# Patient Record
Sex: Male | Born: 1990
Health system: Southern US, Community
[De-identification: ages and names within clinical notes are randomized; demographics above are authoritative.]

## PROBLEM LIST (undated history)

## (undated) DIAGNOSIS — R4689 Other symptoms and signs involving appearance and behavior: Secondary | ICD-10-CM

## (undated) HISTORY — PX: TONSILLECTOMY: SUR1361

## (undated) HISTORY — DX: Other symptoms and signs involving appearance and behavior: R46.89

---
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
## 2012-03-04 IMAGING — CR DG TIBIA/FIBULA 2V*R*
2 series · 2 of 2 positions shown · non-contrast
Comparison: None.

CLINICAL DATA: Lower leg pain.

RIGHT TIBIA AND FIBULA - 2 VIEW

[view not recorded (1 of 2)]
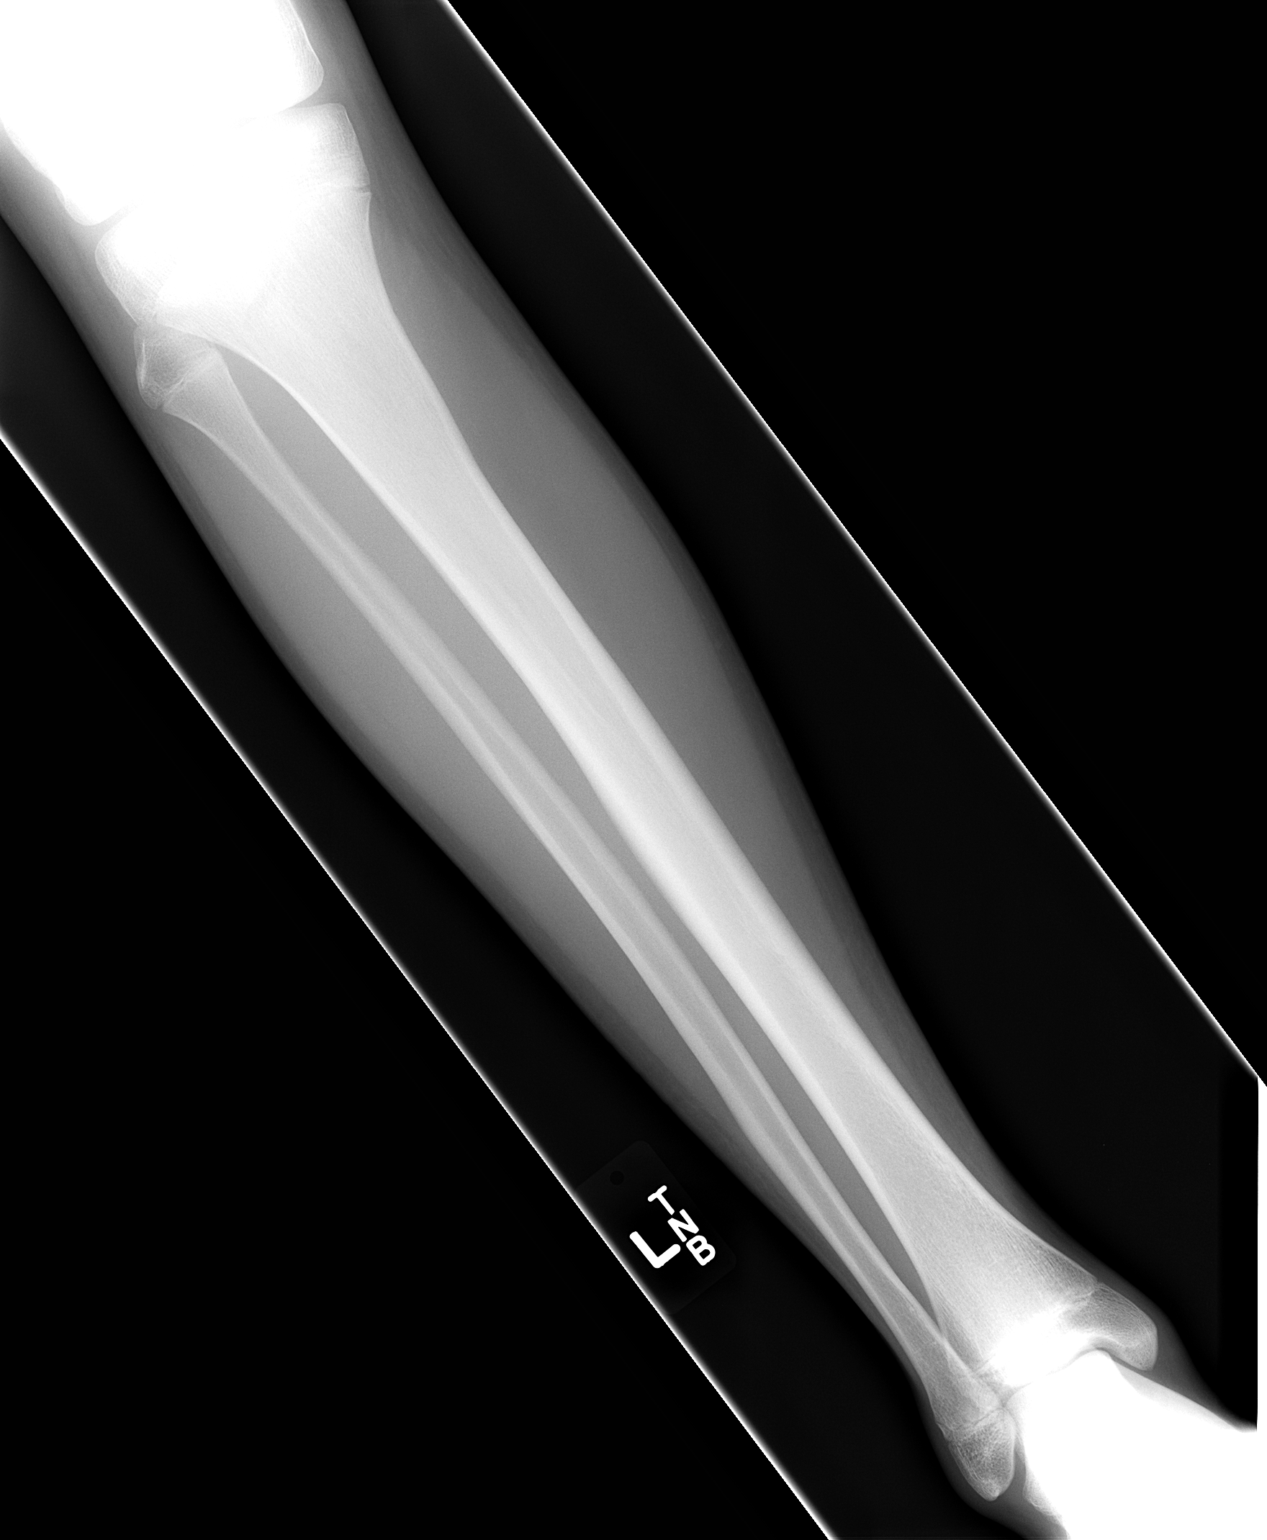

[view not recorded (2 of 2)]
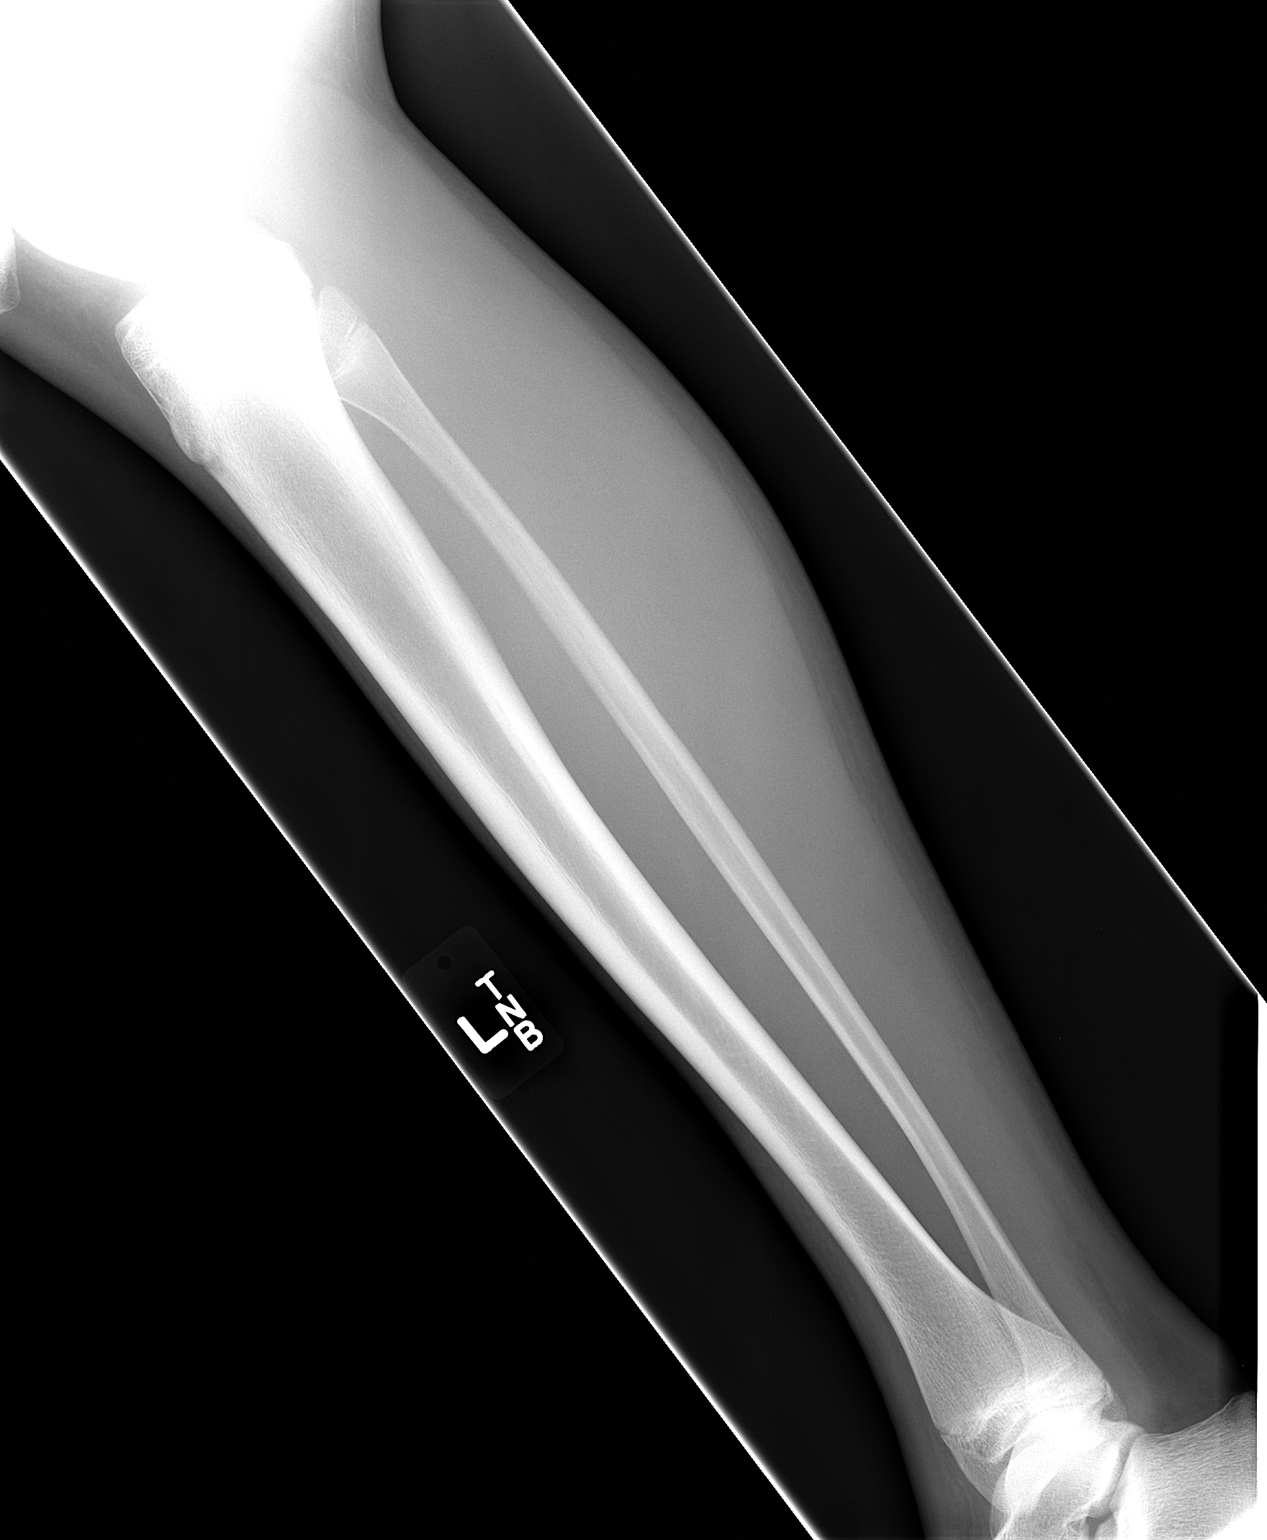

[2 of 2 positions shown; findings below may reference images not displayed]

FINDINGS: No acute osseous abnormality.
IMPRESSION: No acute osseous abnormality.

## 2012-07-30 ENCOUNTER — Ambulatory Visit: Payer: Self-pay | Admitting: Pediatrics

## 2012-08-01 ENCOUNTER — Encounter: Payer: Self-pay | Admitting: Pediatrics

## 2012-08-01 ENCOUNTER — Ambulatory Visit (INDEPENDENT_AMBULATORY_CARE_PROVIDER_SITE_OTHER): Payer: 59 | Admitting: Pediatrics

## 2012-08-01 VITALS — Temp 98.4°F | Wt 122.4 lb

## 2012-08-01 DIAGNOSIS — Z008 Encounter for other general examination: Secondary | ICD-10-CM

## 2012-08-01 DIAGNOSIS — IMO0001 Reserved for inherently not codable concepts without codable children: Secondary | ICD-10-CM

## 2012-08-01 DIAGNOSIS — Z0189 Encounter for other specified special examinations: Secondary | ICD-10-CM

## 2012-08-01 DIAGNOSIS — R4689 Other symptoms and signs involving appearance and behavior: Secondary | ICD-10-CM

## 2012-08-01 HISTORY — DX: Other symptoms and signs involving appearance and behavior: R46.89

## 2012-08-01 LAB — PULMONARY FUNCTION TEST

## 2012-08-01 MED ORDER — ALBUTEROL SULFATE (2.5 MG/3ML) 0.083% IN NEBU
2.5000 mg | INHALATION_SOLUTION | Freq: Once | RESPIRATORY_TRACT | Status: AC
  Administered 2012-08-01: 2.5 mg via RESPIRATORY_TRACT

## 2012-08-01 NOTE — Addendum Note (Signed)
Addended by: Rolena Infante on: 08/01/2012 11:44 AM   Modules accepted: Orders

## 2012-08-01 NOTE — Patient Instructions (Signed)
Nicotine Addiction Nicotine can act as both a stimulant (excites/activates) and a sedative (calms/quiets). Immediately after exposure to nicotine, there is a "kick" caused in part by the drug's stimulation of the adrenal glands and resulting discharge of adrenaline (epinephrine). The rush of adrenaline stimulates the body and causes a sudden release of sugar. This means that smokers are always slightly hyperglycemic. Hyperglycemic means that the blood sugar is high, just like in diabetics. Nicotine also decreases the amount of insulin which helps control sugar levels in the body. There is an increase in blood pressure, breathing, and the rate of heart beats.  In addition, nicotine indirectly causes a release of dopamine in the brain that controls pleasure and motivation. A similar reaction is seen with other drugs of abuse, such as cocaine and heroin. This dopamine release is thought to cause the pleasurable sensations when smoking. In some different cases, nicotine can also create a calming effect, depending on sensitivity of the smoker's nervous system and the dose of nicotine taken. WHAT HAPPENS WHEN NICOTINE IS TAKEN FOR LONG PERIODS OF TIME?  Long-term use of nicotine results in addiction. It is difficult to stop.  Repeated use of nicotine creates tolerance. Higher doses of nicotine are needed to get the "kick." When nicotine use is stopped, withdrawal may last a month or more. Withdrawal may begin within a few hours after the last cigarette. Symptoms peak within the first few days and may lessen within a few weeks. For some people, however, symptoms may last for months or longer. Withdrawal symptoms include:   Irritability.  Craving.  Learning and attention deficits.  Sleep disturbances.  Increased appetite. Craving for tobacco may last for 6 months or longer. Many behaviors done while using nicotine can also play a part in the severity of withdrawal symptoms. For some people, the feel,  smell, and sight of a cigarette and the ritual of obtaining, handling, lighting, and smoking the cigarette are closely linked with the pleasure of smoking. When stopped, they also miss the related behaviors which make the withdrawal or craving worse. While nicotine gum and patches may lessen the drug aspects of withdrawal, cravings often persist. WHAT ARE THE MEDICAL CONSEQUENCES OF NICOTINE USE?  Nicotine addiction accounts for one-third of all cancers. The top cancer caused by tobacco is lung cancer. Lung cancer is the number one cancer killer of both men and women.  Smoking is also associated with cancers of the:  Mouth.  Pharynx.  Larynx.  Esophagus.  Stomach.  Pancreas.  Cervix.  Kidney.  Ureter.  Bladder.  Smoking also causes lung diseases such as lasting (chronic) bronchitis and emphysema.  It worsens asthma in adults and children.  Smoking increases the risk of heart disease, including:  Stroke.  Heart attack.  Vascular disease.  Aneurysm.  Passive or secondary smoke can also increase medical risks including:  Asthma in children.  Sudden Infant Death Syndrome (SIDS).  Additionally, dropped cigarettes are the leading cause of residential fire fatalities.  Nicotine poisoning has been reported from accidental ingestion of tobacco products by children and pets. Death usually results in a few minutes from respiratory failure (when a person stops breathing) caused by paralysis. TREATMENT   Medication. Nicotine replacement medicines such as nicotine gum and the patch are used to stop smoking. These medicines gradually lower the dosage of nicotine in the body. These medicines do not contain the carbon monoxide and other toxins found in tobacco smoke.  Hypnotherapy.  Relaxation therapy.  Nicotine Anonymous (a 12-step support   program). Find times and locations in your local yellow pages. Document Released: 09/07/2003 Document Revised: 03/26/2011 Document  Reviewed: 01/29/2007 [employer] Patient Information 2014 [employer].  

## 2012-08-01 NOTE — Progress Notes (Signed)
Patient ID: Christian Miranda, male   DOB: 09/04/1990, 22 y.o.   MRN: [id number]   Subjective:     Patient ID: Christian Miranda, male   DOB: August 19, 1990, 22 y.o.   MRN: [id number]  HPI: Pt here alone for PFTs required for joining the [employer]. The pt has a h/o mild asthma. He has not used and inhaler since elementary school. The pt is a smoker, but has cut down drastically to 1 cigarette a day now. He has been chewing tobacco to help with this.   ROS:  Apart from the symptoms reviewed above, there are no other symptoms referable to all systems reviewed. There was an issue with depression/ anxiety in the past. The pt had refused medications and therapy. He now reports no symptoms of depression.   Physical Examination  Temperature 98.4 F (36.9 C), temperature source Temporal, weight 122 lb 6 oz (55.509 kg). General: Alert, NAD HEENT: TM's - clear, Throat - clear, Neck - FROM, no meningismus, Sclera - clear LYMPH NODES: No LN noted LUNGS: CTA B CV: RRR without Murmurs  No results found. No results found for this or any previous visit (from the past 240 hour(s)). No results found for this or any previous visit (from the past 48 hour(s)).  Assessment:   PFTs done before and after Albuterol nebulizer treatment: no significant difference. Both wnl.  Plan:   Form with results given. Discussed careful use of chewing tobacco. RTC PRN

## 2013-01-27 ENCOUNTER — Ambulatory Visit (INDEPENDENT_AMBULATORY_CARE_PROVIDER_SITE_OTHER): Payer: 59 | Admitting: Family Medicine

## 2013-01-27 ENCOUNTER — Encounter: Payer: Self-pay | Admitting: Family Medicine

## 2013-01-27 VITALS — BP 100/50 | HR 70 | Temp 97.8°F | Resp 18 | Ht 68.0 in | Wt 128.1 lb

## 2013-01-27 DIAGNOSIS — R062 Wheezing: Secondary | ICD-10-CM

## 2013-01-27 DIAGNOSIS — R059 Cough, unspecified: Secondary | ICD-10-CM | POA: Insufficient documentation

## 2013-01-27 DIAGNOSIS — R093 Abnormal sputum: Secondary | ICD-10-CM

## 2013-01-27 DIAGNOSIS — R05 Cough: Secondary | ICD-10-CM

## 2013-01-27 DIAGNOSIS — J189 Pneumonia, unspecified organism: Secondary | ICD-10-CM

## 2013-01-27 DIAGNOSIS — R0902 Hypoxemia: Secondary | ICD-10-CM

## 2013-01-27 DIAGNOSIS — R0789 Other chest pain: Secondary | ICD-10-CM | POA: Insufficient documentation

## 2013-01-27 DIAGNOSIS — R071 Chest pain on breathing: Secondary | ICD-10-CM

## 2013-01-27 MED ORDER — AZITHROMYCIN 250 MG PO TABS: ORAL_TABLET | ORAL | Status: DC

## 2013-01-27 MED ORDER — PREDNISONE 20 MG PO TABS: 20.0000 mg | ORAL_TABLET | Freq: Every day | ORAL | Status: DC

## 2013-01-27 MED ORDER — ALBUTEROL SULFATE HFA 108 (90 BASE) MCG/ACT IN AERS: 2.0000 | INHALATION_SPRAY | RESPIRATORY_TRACT | Status: DC | PRN

## 2013-01-27 NOTE — Patient Instructions (Signed)
Prednisone tablets What is this medicine? PREDNISONE (PRED ni sone) is a corticosteroid. It is commonly used to treat inflammation of the skin, joints, lungs, and other organs. Common conditions treated include asthma or any infection of the lungs such as pneumonia or bronchitis, allergies, and arthritis. It is also used for other conditions, such as blood disorders and diseases of the adrenal glands. This medicine may be used for other purposes; ask your health care provider or pharmacist if you have questions. COMMON BRAND NAME(S): Deltasone, Predone, Sterapred DS, Sterapred What should I tell my health care provider before I take this medicine? They need to know if you have any of these conditions: -Cushing's syndrome -diabetes -glaucoma -heart disease -high blood pressure -infection (especially a virus infection such as chickenpox, cold sores, or herpes) -kidney disease -liver disease -mental illness -myasthenia gravis -osteoporosis -seizures -stomach or intestine problems -thyroid disease -an unusual or allergic reaction to lactose, prednisone, other medicines, foods, dyes, or preservatives -pregnant or trying to get pregnant -breast-feeding How should I use this medicine? Take this medicine by mouth with a glass of water. Follow the directions on the prescription label. Take this medicine with food. If you are taking this medicine once a day, take it in the morning. Do not take more medicine than you are told to take. Do not suddenly stop taking your medicine because you may develop a severe reaction. Your doctor will tell you how much medicine to take. If your doctor wants you to stop the medicine, the dose may be slowly lowered over time to avoid any side effects. Talk to your pediatrician regarding the use of this medicine in children. Special care may be needed. Overdosage: If you think you have taken too much of this medicine contact a poison control center or emergency room at  once. NOTE: This medicine is only for you. Do not share this medicine with others. What if I miss a dose? If you miss a dose, take it as soon as you can. If it is almost time for your next dose, talk to your doctor or health care professional. You may need to miss a dose or take an extra dose. Do not take double or extra doses without advice. What may interact with this medicine? Do not take this medicine with any of the following medications: -metyrapone -mifepristone This medicine may also interact with the following medications: -aminoglutethimide -amphotericin B -aspirin and aspirin-like medicines -barbiturates -certain medicines for diabetes, like glipizide or glyburide -cholestyramine -cholinesterase inhibitors -cyclosporine -digoxin -diuretics -ephedrine -male hormones, like estrogens and birth control pills -isoniazid -ketoconazole -NSAIDS, medicines for pain and inflammation, like ibuprofen or naproxen -phenytoin -rifampin -toxoids -vaccines -warfarin This list may not describe all possible interactions. Give your health care provider a list of all the medicines, herbs, non-prescription drugs, or dietary supplements you use. Also tell them if you smoke, drink alcohol, or use illegal drugs. Some items may interact with your medicine. What should I watch for while using this medicine? Visit your doctor or health care professional for regular checks on your progress. If you are taking this medicine over a prolonged period, carry an identification card with your name and address, the type and dose of your medicine, and your doctor's name and address. This medicine may increase your risk of getting an infection. Tell your doctor or health care professional if you are around anyone with measles or chickenpox, or if you develop sores or blisters that do not heal properly. If you are going  to have surgery, tell your doctor or health care professional that you have taken this  medicine within the last twelve months. Ask your doctor or health care professional about your diet. You may need to lower the amount of salt you eat. This medicine may affect blood sugar levels. If you have diabetes, check with your doctor or health care professional before you change your diet or the dose of your diabetic medicine. What side effects may I notice from receiving this medicine? Side effects that you should report to your doctor or health care professional as soon as possible: -allergic reactions like skin rash, itching or hives, swelling of the face, lips, or tongue -changes in emotions or moods -changes in vision -depressed mood -eye pain -fever or chills, cough, sore throat, pain or difficulty passing urine -increased thirst -swelling of ankles, feet Side effects that usually do not require medical attention (report to your doctor or health care professional if they continue or are bothersome): -confusion, excitement, restlessness -headache -nausea, vomiting -skin problems, acne, thin and shiny skin -trouble sleeping -weight gain This list may not describe all possible side effects. Call your doctor for medical advice about side effects. You may report side effects to FDA at 1-800-FDA-1088. Where should I keep my medicine? Keep out of the reach of children. Store at room temperature between 15 and 30 degrees C (59 and 86 degrees F). Protect from light. Keep container tightly closed. Throw away any unused medicine after the expiration date. NOTE: This sheet is a summary. It may not cover all possible information. If you have questions about this medicine, talk to your doctor, pharmacist, or health care provider.  2014, [employer]. (2010-08-17 10:57:14) Albuterol inhalation aerosol What is this medicine? ALBUTEROL (al Gaspar Bidding) is a bronchodilator. It helps open up the airways in your lungs to make it easier to breathe. This medicine is used to treat and  to prevent bronchospasm. This medicine may be used for other purposes; ask your health care provider or pharmacist if you have questions. COMMON BRAND NAME(S): Proair HFA, Proventil HFA, Proventil, Respirol , Ventolin HFA, Ventolin What should I tell my health care provider before I take this medicine? They need to know if you have any of the following conditions: -diabetes -heart disease or irregular heartbeat -high blood pressure -pheochromocytoma -seizures -thyroid disease -an unusual or allergic reaction to albuterol, levalbuterol, sulfites, other medicines, foods, dyes, or preservatives -pregnant or trying to get pregnant -breast-feeding How should I use this medicine? This medicine is for inhalation through the mouth. Follow the directions on your prescription label. Take your medicine at regular intervals. Do not use more often than directed. Make sure that you are using your inhaler correctly. Ask you doctor or health care provider if you have any questions. Talk to your pediatrician regarding the use of this medicine in children. Special care may be needed. Overdosage: If you think you have taken too much of this medicine contact a poison control center or emergency room at once. NOTE: This medicine is only for you. Do not share this medicine with others. What if I miss a dose? If you miss a dose, use it as soon as you can. If it is almost time for your next dose, use only that dose. Do not use double or extra doses. What may interact with this medicine? -anti-infectives like chloroquine and pentamidine -caffeine -cisapride -diuretics -medicines for colds -medicines for depression or for emotional or psychotic conditions -medicines for weight loss  including some herbal products -methadone -some antibiotics like clarithromycin, erythromycin, levofloxacin, and linezolid -some heart medicines -steroid hormones like dexamethasone, cortisone,  hydrocortisone -theophylline -thyroid hormones This list may not describe all possible interactions. Give your health care provider a list of all the medicines, herbs, non-prescription drugs, or dietary supplements you use. Also tell them if you smoke, drink alcohol, or use illegal drugs. Some items may interact with your medicine. What should I watch for while using this medicine? Tell your doctor or health care professional if your symptoms do not improve. Do not use extra albuterol. If your asthma or bronchitis gets worse while you are using this medicine, call your doctor right away. If your mouth gets dry try chewing sugarless gum or sucking hard candy. Drink water as directed. What side effects may I notice from receiving this medicine? Side effects that you should report to your doctor or health care professional as soon as possible: -allergic reactions like skin rash, itching or hives, swelling of the face, lips, or tongue -breathing problems -chest pain -feeling faint or lightheaded, falls -high blood pressure -irregular heartbeat -fever -muscle cramps or weakness -pain, tingling, numbness in the hands or feet -vomiting Side effects that usually do not require medical attention (report to your doctor or health care professional if they continue or are bothersome): -cough -difficulty sleeping -headache -nervousness or trembling -stomach upset -stuffy or runny nose -throat irritation -unusual taste This list may not describe all possible side effects. Call your doctor for medical advice about side effects. You may report side effects to FDA at 1-800-FDA-1088. Where should I keep my medicine? Keep out of the reach of children. Store at room temperature between 15 and 30 degrees C (59 and 86 degrees F). The contents are under pressure and may burst when exposed to heat or flame. Do not freeze. This medicine does not work as well if it is too cold. Throw away any unused medicine  after the expiration date. Inhalers need to be thrown away after the labeled number of puffs have been used or by the expiration date; whichever comes first. Ventolin HFA should be thrown away 12 months after removing from foil pouch. Check the instructions that come with your medicine. NOTE: This sheet is a summary. It may not cover all possible information. If you have questions about this medicine, talk to your doctor, pharmacist, or health care provider.  2014, [employer] Standard. (2012-06-19 10:57:17) Azithromycin tablets What is this medicine? AZITHROMYCIN (az ith roe MYE sin) is a macrolide antibiotic. It is used to treat or prevent certain kinds of bacterial infections. It will not work for colds, flu, or other viral infections. This medicine may be used for other purposes; ask your health care provider or pharmacist if you have questions. COMMON BRAND NAME(S): Zithromax Tri-Pak, Zithromax Z-Pak, Zithromax What should I tell my health care provider before I take this medicine? They need to know if you have any of these conditions: -kidney disease -liver disease -irregular heartbeat or heart disease -an unusual or allergic reaction to azithromycin, erythromycin, other macrolide antibiotics, foods, dyes, or preservatives -pregnant or trying to get pregnant -breast-feeding How should I use this medicine? Take this medicine by mouth with a full glass of water. Follow the directions on the prescription label. The tablets can be taken with food or on an empty stomach. If the medicine upsets your stomach, take it with food. Take your medicine at regular intervals. Do not take your medicine more often than directed. Take  all of your medicine as directed even if you think your are better. Do not skip doses or stop your medicine early. Talk to your pediatrician regarding the use of this medicine in children. Special care may be needed. Overdosage: If you think you have taken too much of this  medicine contact a poison control center or emergency room at once. NOTE: This medicine is only for you. Do not share this medicine with others. What if I miss a dose? If you miss a dose, take it as soon as you can. If it is almost time for your next dose, take only that dose. Do not take double or extra doses. What may interact with this medicine? Do not take this medicine with any of the following medications: -lincomycin This medicine may also interact with the following medications: -amiodarone -antacids -cyclosporine -digoxin -magnesium -nelfinavir -phenytoin -warfarin This list may not describe all possible interactions. Give your health care provider a list of all the medicines, herbs, non-prescription drugs, or dietary supplements you use. Also tell them if you smoke, drink alcohol, or use illegal drugs. Some items may interact with your medicine. What should I watch for while using this medicine? Tell your doctor or health care professional if your symptoms do not improve. Do not treat diarrhea with over the counter products. Contact your doctor if you have diarrhea that lasts more than 2 days or if it is severe and watery. This medicine can make you more sensitive to the sun. Keep out of the sun. If you cannot avoid being in the sun, wear protective clothing and use sunscreen. Do not use sun lamps or tanning beds/booths. What side effects may I notice from receiving this medicine? Side effects that you should report to your doctor or health care professional as soon as possible: -allergic reactions like skin rash, itching or hives, swelling of the face, lips, or tongue -confusion, nightmares or hallucinations -dark urine -difficulty breathing -hearing loss -irregular heartbeat or chest pain -pain or difficulty passing urine -redness, blistering, peeling or loosening of the skin, including inside the mouth -white patches or sores in the mouth -yellowing of the eyes or  skin Side effects that usually do not require medical attention (report to your doctor or health care professional if they continue or are bothersome): -diarrhea -dizziness, drowsiness -headache -stomach upset or vomiting -tooth discoloration -vaginal irritation This list may not describe all possible side effects. Call your doctor for medical advice about side effects. You may report side effects to FDA at 1-800-FDA-1088. Where should I keep my medicine? Keep out of the reach of children. Store at room temperature between 15 and 30 degrees C (59 and 86 degrees F). Throw away any unused medicine after the expiration date. NOTE: This sheet is a summary. It may not cover all possible information. If you have questions about this medicine, talk to your doctor, pharmacist, or health care provider.  2014, [employer] Standard. (2012-06-25 15:42:07) Pneumonia, Adult Pneumonia is an infection of the lungs.  CAUSES Pneumonia may be caused by bacteria or a virus. Usually, these infections are caused by breathing infectious particles into the lungs (respiratory tract). SYMPTOMS   Cough.  Fever.  Chest pain.  Increased rate of breathing.  Wheezing.  Mucus production. DIAGNOSIS  If you have the common symptoms of pneumonia, your caregiver will typically confirm the diagnosis with a chest X-ray. The X-ray will show an abnormality in the lung (pulmonary infiltrate) if you have pneumonia. Other tests of your blood, urine,  or sputum may be done to find the specific cause of your pneumonia. Your caregiver may also do tests (blood gases or pulse oximetry) to see how well your lungs are working. TREATMENT  Some forms of pneumonia may be spread to other people when you cough or sneeze. You may be asked to wear a mask before and during your exam. Pneumonia that is caused by bacteria is treated with antibiotic medicine. Pneumonia that is caused by the influenza virus may be treated with an antiviral  medicine. Most other viral infections must run their course. These infections will not respond to antibiotics.  PREVENTION A pneumococcal shot (vaccine) is available to prevent a common bacterial cause of pneumonia. This is usually suggested for:  People over 22 years old.  Patients on chemotherapy.  People with chronic lung problems, such as bronchitis or emphysema.  People with immune system problems. If you are over 65 or have a high risk condition, you may receive the pneumococcal vaccine if you have not received it before. In some countries, a routine influenza vaccine is also recommended. This vaccine can help prevent some cases of pneumonia.You may be offered the influenza vaccine as part of your care. If you smoke, it is time to quit. You may receive instructions on how to stop smoking. Your caregiver can provide medicines and counseling to help you quit. HOME CARE INSTRUCTIONS   Cough suppressants may be used if you are losing too much rest. However, coughing protects you by clearing your lungs. You should avoid using cough suppressants if you can.  Your caregiver may have prescribed medicine if he or she thinks your pneumonia is caused by a bacteria or influenza. Finish your medicine even if you start to feel better.  Your caregiver may also prescribe an expectorant. This loosens the mucus to be coughed up.  Only take over-the-counter or prescription medicines for pain, discomfort, or fever as directed by your caregiver.  Do not smoke. Smoking is a common cause of bronchitis and can contribute to pneumonia. If you are a smoker and continue to smoke, your cough may last several weeks after your pneumonia has cleared.  A cold steam vaporizer or humidifier in your room or home may help loosen mucus.  Coughing is often worse at night. Sleeping in a semi-upright position in a recliner or using a couple pillows under your head will help with this.  Get rest as you feel it is needed.  Your body will usually let you know when you need to rest. SEEK IMMEDIATE MEDICAL CARE IF:   Your illness becomes worse. This is especially true if you are elderly or weakened from any other disease.  You cannot control your cough with suppressants and are losing sleep.  You begin coughing up blood.  You develop pain which is getting worse or is uncontrolled with medicines.  You have a fever.  Any of the symptoms which initially brought you in for treatment are getting worse rather than better.  You develop shortness of breath or chest pain. MAKE SURE YOU:   Understand these instructions.  Will watch your condition.  Will get help right away if you are not doing well or get worse. Document Released: 01/01/2005 Document Revised: 03/26/2011 Document Reviewed: 03/23/2010 [facility] CenterExitCare Patient Information 2014 [place], [place].

## 2013-01-27 NOTE — Progress Notes (Signed)
Subjective:     Patient ID: Christian Miranda, male   DOB: May 11, 1990, 23 y.o.   MRN: [id number]  Chest Pain  The current episode started in the past 7 days. The onset quality is gradual. The problem occurs 2 to 4 times per day. The problem has been gradually worsening. The pain is present in the lateral region (right). The pain is at a severity of 5/10. The pain is mild. The quality of the pain is described as sharp. The pain does not radiate. Associated symptoms include a fever, headaches, shortness of breath and sputum production. Pertinent negatives include no abdominal pain, back pain, cough, diaphoresis, dizziness, irregular heartbeat, lower extremity edema, malaise/fatigue, nausea, near-syncope, numbness, orthopnea, palpitations, syncope, vomiting or weakness. The cough's precipitants include smoke and activity. The cough is productive. There is no color change associated with the cough. Nothing relieves the cough. The cough is worsened by activity and a supine position. He has tried rest and acetaminophen (mucinex) for the symptoms. The treatment provided mild relief. Past medical history comments: tobacco abuse  Sore Throat  This is a new problem. The current episode started in the past 7 days. The problem has been unchanged. Neither side of throat is experiencing more pain than the other. Associated symptoms include congestion, headaches and shortness of breath. Pertinent negatives include no abdominal pain, coughing, diarrhea or vomiting.  Cough This is a new problem. The current episode started in the past 7 days. The problem has been gradually worsening. The problem occurs every few hours. The cough is productive of sputum. Associated symptoms include chest pain, chills, a fever, headaches, rhinorrhea, a sore throat and shortness of breath. Pertinent negatives include no wheezing. He has tried OTC cough suppressant for the symptoms. The treatment provided mild relief. There is no history of  environmental allergies. tobacco abuse     Review of Systems  Constitutional: Positive for fever, chills, activity change and appetite change. Negative for malaise/fatigue, diaphoresis, fatigue and unexpected weight change.  HENT: Positive for congestion, rhinorrhea, sinus pressure, sneezing, sore throat and voice change.   Eyes: Negative for visual disturbance.  Respiratory: Positive for sputum production and shortness of breath. Negative for cough, chest tightness and wheezing.   Cardiovascular: Positive for chest pain. Negative for palpitations, orthopnea, syncope and near-syncope.       Right sided chest/flank pain   Gastrointestinal: Negative for nausea, vomiting, abdominal pain and diarrhea.  Genitourinary: Negative for dysuria and difficulty urinating.  Musculoskeletal: Negative for back pain.  Allergic/Immunologic: Negative for environmental allergies.  Neurological: Positive for headaches. Negative for dizziness, syncope, weakness and numbness.  Hematological: Negative for adenopathy.       Objective:   Physical Exam  Nursing note and vitals reviewed. Constitutional: He is oriented to person, place, and time. He appears well-developed and well-nourished.  HENT:  Head: Normocephalic and atraumatic.  Right Ear: External ear normal.  Nose: Nose normal.  Mouth/Throat: Oropharynx is clear and moist.  Eyes: Conjunctivae are normal. Pupils are equal, round, and reactive to light.  Neck: Normal range of motion. Neck supple. No thyromegaly present.  Cardiovascular: Normal rate, regular rhythm and normal heart sounds.   Pulmonary/Chest: Effort normal. No respiratory distress. He has wheezes. He exhibits no tenderness.  Decreased breath sounds and expiratory wheeze on the right   Abdominal: Soft. Bowel sounds are normal.  Lymphadenopathy:    He has no cervical adenopathy.  Neurological: He is alert and oriented to person, place, and time.  Skin: Skin is  warm and dry.   Psychiatric: He has a normal mood and affect. His behavior is normal. Judgment and thought content normal.       Assessment:     Ladona Ridgelaylor was seen today for chest pain, sore throat and cough.  Diagnoses and associated orders for this visit:  CAP (community acquired pneumonia) - azithromycin (ZITHROMAX) 250 MG tablet; Take 2 tabs po on day 1 then take 1 tab po daily for 4 days - albuterol (PROVENTIL HFA;VENTOLIN HFA) 108 (90 BASE) MCG/ACT inhaler; Inhale 2 puffs into the lungs every 4 (four) hours as needed for wheezing or shortness of breath. - predniSONE (DELTASONE) 20 MG tablet; Take 1 tablet (20 mg total) by mouth daily with breakfast.  Cough - azithromycin (ZITHROMAX) 250 MG tablet; Take 2 tabs po on day 1 then take 1 tab po daily for 4 days - albuterol (PROVENTIL HFA;VENTOLIN HFA) 108 (90 BASE) MCG/ACT inhaler; Inhale 2 puffs into the lungs every 4 (four) hours as needed for wheezing or shortness of breath. - predniSONE (DELTASONE) 20 MG tablet; Take 1 tablet (20 mg total) by mouth daily with breakfast.  Hypoxia - azithromycin (ZITHROMAX) 250 MG tablet; Take 2 tabs po on day 1 then take 1 tab po daily for 4 days - albuterol (PROVENTIL HFA;VENTOLIN HFA) 108 (90 BASE) MCG/ACT inhaler; Inhale 2 puffs into the lungs every 4 (four) hours as needed for wheezing or shortness of breath. - predniSONE (DELTASONE) 20 MG tablet; Take 1 tablet (20 mg total) by mouth daily with breakfast.  Wheezing - azithromycin (ZITHROMAX) 250 MG tablet; Take 2 tabs po on day 1 then take 1 tab po daily for 4 days - albuterol (PROVENTIL HFA;VENTOLIN HFA) 108 (90 BASE) MCG/ACT inhaler; Inhale 2 puffs into the lungs every 4 (four) hours as needed for wheezing or shortness of breath. - predniSONE (DELTASONE) 20 MG tablet; Take 1 tablet (20 mg total) by mouth daily with breakfast.  Right-sided chest wall pain - azithromycin (ZITHROMAX) 250 MG tablet; Take 2 tabs po on day 1 then take 1 tab po daily for 4  days - albuterol (PROVENTIL HFA;VENTOLIN HFA) 108 (90 BASE) MCG/ACT inhaler; Inhale 2 puffs into the lungs every 4 (four) hours as needed for wheezing or shortness of breath. - predniSONE (DELTASONE) 20 MG tablet; Take 1 tablet (20 mg total) by mouth daily with breakfast.  Abnormal sputum color - azithromycin (ZITHROMAX) 250 MG tablet; Take 2 tabs po on day 1 then take 1 tab po daily for 4 days - albuterol (PROVENTIL HFA;VENTOLIN HFA) 108 (90 BASE) MCG/ACT inhaler; Inhale 2 puffs into the lungs every 4 (four) hours as needed for wheezing or shortness of breath. - predniSONE (DELTASONE) 20 MG tablet; Take 1 tablet (20 mg total) by mouth daily with breakfast.       Plan:     Symptoms likely due to community acquired pneumonia. Treating with z pack, albuterol, and prednisone. To follow up after 5 day course if no better or sooner if conditions worsens.     Counseled on smoking cessation

## 2013-08-08 ENCOUNTER — Emergency Department (HOSPITAL_COMMUNITY)
Admission: EM | Admit: 2013-08-08 | Discharge: 2013-08-09 | Disposition: A | Discharge status: Home / Self Care | Payer: 59 | Attending: Emergency Medicine | Admitting: Emergency Medicine

## 2013-08-08 ENCOUNTER — Encounter (HOSPITAL_COMMUNITY): Payer: Self-pay | Admitting: Emergency Medicine

## 2013-08-08 DIAGNOSIS — F1010 Alcohol abuse, uncomplicated: Secondary | ICD-10-CM | POA: Diagnosis not present

## 2013-08-08 DIAGNOSIS — IMO0002 Reserved for concepts with insufficient information to code with codable children: Secondary | ICD-10-CM | POA: Insufficient documentation

## 2013-08-08 DIAGNOSIS — J45909 Unspecified asthma, uncomplicated: Secondary | ICD-10-CM | POA: Insufficient documentation

## 2013-08-08 DIAGNOSIS — Z792 Long term (current) use of antibiotics: Secondary | ICD-10-CM | POA: Diagnosis not present

## 2013-08-08 DIAGNOSIS — Z79899 Other long term (current) drug therapy: Secondary | ICD-10-CM | POA: Diagnosis not present

## 2013-08-08 DIAGNOSIS — F17200 Nicotine dependence, unspecified, uncomplicated: Secondary | ICD-10-CM | POA: Diagnosis not present

## 2013-08-08 DIAGNOSIS — Z8659 Personal history of other mental and behavioral disorders: Secondary | ICD-10-CM | POA: Diagnosis not present

## 2013-08-08 DIAGNOSIS — R45851 Suicidal ideations: Secondary | ICD-10-CM | POA: Diagnosis not present

## 2013-08-08 LAB — COMPREHENSIVE METABOLIC PANEL
ALT: 16 U/L (ref 0–53)
AST: 17 U/L (ref 0–37)
Albumin: 4.5 g/dL (ref 3.5–5.2)
Alkaline Phosphatase: 40 U/L (ref 39–117)
Anion gap: 16 — ABNORMAL HIGH (ref 5–15)
BUN: 8 mg/dL (ref 6–23)
CALCIUM: 9.6 mg/dL (ref 8.4–10.5)
CO2: 26 meq/L (ref 19–32)
CREATININE: 0.73 mg/dL (ref 0.50–1.35)
Chloride: 101 mEq/L (ref 96–112)
GFR calc Af Amer: >90 mL/min (ref >90)
Glucose, Bld: 101 mg/dL — ABNORMAL HIGH (ref 70–99)
Potassium: 3.8 mEq/L (ref 3.7–5.3)
Sodium: 143 mEq/L (ref 137–147)
Total Bilirubin: 0.4 mg/dL (ref 0.3–1.2)
Total Protein: 7.6 g/dL (ref 6.0–8.3)

## 2013-08-08 LAB — CBC
HCT: 45.3 % (ref 39.0–52.0)
Hemoglobin: 15.5 g/dL (ref 13.0–17.0)
MCH: 29.3 pg (ref 26.0–34.0)
MCHC: 34.2 g/dL (ref 30.0–36.0)
MCV: 85.6 fL (ref 78.0–100.0)
PLATELETS: 275 10*3/uL (ref 150–400)
RBC: 5.29 MIL/uL (ref 4.22–5.81)
RDW: 13.8 % (ref 11.5–15.5)
WBC: 10.1 10*3/uL (ref 4.0–10.5)

## 2013-08-08 LAB — ETHANOL

## 2013-08-08 LAB — SALICYLATE LEVEL

## 2013-08-08 MED ORDER — LORAZEPAM 1 MG PO TABS: 0.0000 mg | ORAL_TABLET | Freq: Two times a day (BID) | ORAL | Status: DC

## 2013-08-08 MED ORDER — LORAZEPAM 1 MG PO TABS: 0.0000 mg | ORAL_TABLET | Freq: Four times a day (QID) | ORAL | Status: DC

## 2013-08-08 NOTE — ED Notes (Signed)
Writer spoke with Selena BattenKim, RN who provided report. Tele-assessment scheduled for 00:10.  Janann ColonelGregory Pickett Jr. MSW, LCSW [facility]-Triage Specialist   Phone: [phone] Fax: [phone]

## 2013-08-08 NOTE — ED Provider Notes (Signed)
CSN: [id number]     Arrival date & time 08/08/13  1955 History   First MD Initiated Contact with Patient 08/08/13 2312     Chief Complaint  Patient presents with  . Alcohol Problem     (Consider location/radiation/quality/duration/timing/severity/associated sxs/prior Treatment) HPI Comments: Patient presents to the emergency department with chief complaint of requesting detox. He states that he drinks about a case of beer per day. He states that he realizes is becoming a problem, and would like to stop. He reports suicidal ideation, when he is under the influence home. Currently he denies any SI or HI. States that his last drink was this morning at 4 AM. He denies being in any pain. Denies any tremors currently. Denies any illicit drug use.  The history is provided by the patient. No language interpreter was used.    Past Medical History  Diagnosis Date  . Asthma   . Adolescent behavior problems 08/01/2012   Past Surgical History  Procedure Laterality Date  . Tonsillectomy     History reviewed. No pertinent family history. History  Substance Use Topics  . Smoking status: Current Every Day Smoker    Types: Cigarettes  . Smokeless tobacco: Not on file     Comment: smokes one cigarette/day. trying to quit  . Alcohol Use: Yes    Review of Systems  All other systems reviewed and are negative.     Allergies  Sulfa antibiotics  Home Medications   Prior to Admission medications   Medication Sig Start Date End Date Taking? Authorizing Provider  acetaminophen (TYLENOL) 500 MG tablet Take 500 mg by mouth every 6 (six) hours as needed.    Historical Provider, MD  albuterol (PROVENTIL HFA;VENTOLIN HFA) 108 (90 BASE) MCG/ACT inhaler Inhale 2 puffs into the lungs every 4 (four) hours as needed for wheezing or shortness of breath. 01/27/13   Kela MillinAlethea Y Barrino, MD  azithromycin (ZITHROMAX) 250 MG tablet Take 2 tabs po on day 1 then take 1 tab po daily for 4 days 01/27/13   Kela MillinAlethea Y  Barrino, MD  guaiFENesin (MUCINEX) 600 MG 12 hr tablet Take by mouth 2 (two) times daily.    Historical Provider, MD  IBUPROFEN PO Take by mouth.    Historical Provider, MD  predniSONE (DELTASONE) 20 MG tablet Take 1 tablet (20 mg total) by mouth daily with breakfast. 01/27/13   Kela MillinAlethea Y Barrino, MD  Pseudoephedrine-DM-GG (ROBITUSSIN CF PO) Take by mouth.    Historical Provider, MD   BP 132/77  Pulse 87  Temp(Src) 98.1 F (36.7 C) (Oral)  Resp 18  Wt 130 lb (58.968 kg)  SpO2 98% Physical Exam  Nursing note and vitals reviewed. Constitutional: He is oriented to person, place, and time. He appears well-developed and well-nourished.  HENT:  Head: Normocephalic and atraumatic.  Eyes: Conjunctivae and EOM are normal. Pupils are equal, round, and reactive to light. Right eye exhibits no discharge. Left eye exhibits no discharge. No scleral icterus.  Neck: Normal range of motion. Neck supple. No JVD present.  Cardiovascular: Normal rate, regular rhythm and normal heart sounds.  Exam reveals no gallop and no friction rub.   No murmur heard. Pulmonary/Chest: Effort normal and breath sounds normal. No respiratory distress. He has no wheezes. He has no rales. He exhibits no tenderness.  Abdominal: Soft. He exhibits no distension and no mass. There is no tenderness. There is no rebound and no guarding.  Musculoskeletal: Normal range of motion. He exhibits no edema and no tenderness.  Neurological: He is alert and oriented to person, place, and time.  Skin: Skin is warm and dry.  Psychiatric: He has a normal mood and affect. His behavior is normal. Judgment and thought content normal.    ED Course  Procedures (including critical care time) Results for orders placed during the hospital encounter of 08/08/13  CBC      Result Value Ref Range   WBC 10.1  4.0 - 10.5 K/uL   RBC 5.29  4.22 - 5.81 MIL/uL   Hemoglobin 15.5  13.0 - 17.0 g/dL   HCT 16.1  09.6 - 04.5 %   MCV 85.6  78.0 - 100.0 fL    MCH 29.3  26.0 - 34.0 pg   MCHC 34.2  30.0 - 36.0 g/dL   RDW 40.9  81.1 - 91.4 %   Platelets 275  150 - 400 K/uL  COMPREHENSIVE METABOLIC PANEL      Result Value Ref Range   Sodium 143  137 - 147 mEq/L   Potassium 3.8  3.7 - 5.3 mEq/L   Chloride 101  96 - 112 mEq/L   CO2 26  19 - 32 mEq/L   Glucose, Bld 101 (*) 70 - 99 mg/dL   BUN 8  6 - 23 mg/dL   Creatinine, Ser 7.82  0.50 - 1.35 mg/dL   Calcium 9.6  8.4 - 95.6 mg/dL   Total Protein 7.6  6.0 - 8.3 g/dL   Albumin 4.5  3.5 - 5.2 g/dL   AST 17  0 - 37 U/L   ALT 16  0 - 53 U/L   Alkaline Phosphatase 40  39 - 117 U/L   Total Bilirubin 0.4  0.3 - 1.2 mg/dL   GFR calc non Af Amer >90  >90 mL/min   GFR calc Af Amer >90  >90 mL/min   Anion gap 16 (*) 5 - 15  ETHANOL      Result Value Ref Range   Alcohol, Ethyl (B) <11  0 - 11 mg/dL  SALICYLATE LEVEL      Result Value Ref Range   Salicylate Lvl <2.0 (*) 2.8 - 20.0 mg/dL   No results found.   No results found.   EKG Interpretation None      MDM   Final diagnoses:  None    Patient with alcohol abuse, requesting help. TTS consult pending. Labs are reassuring. Patient is medically clear.    Roxy Horseman, PA-C 08/09/13 [phone]

## 2013-08-08 NOTE — ED Notes (Addendum)
Requesting detox from alcohol, pt denies seizures, shakiness and tremors. Reports heavy alcohol consumption for one year-drinks until alcohol is gone and makes bad choices. Reports drinking 4-5 times a week and drinking anywhere from 5-25 drinks at one time. "I feel like I need alcohol. I don't wake up and drink but when I am at work I do drink. I do not want to go anywhere for this"

## 2013-08-09 ENCOUNTER — Encounter (HOSPITAL_COMMUNITY): Payer: Self-pay | Admitting: *Deleted

## 2013-08-09 LAB — RAPID URINE DRUG SCREEN, HOSP PERFORMED
Amphetamines: NOT DETECTED
BENZODIAZEPINES: NOT DETECTED
Barbiturates: NOT DETECTED
Cocaine: NOT DETECTED
Opiates: NOT DETECTED
Tetrahydrocannabinol: NOT DETECTED

## 2013-08-09 NOTE — ED Provider Notes (Signed)
[id number] - [facility]. Patient seen and does not meet inpatient criteria; also wishes to attempt outpatient treatment intially. Greg faxed over list of resources for assistance with alcohol detox and dependence. Labs reviewed and patient medically cleared. Do not believe benzodiazapines are indicated at d/c given ETOH <11 and lack of tachycardia or tremors. Patient discharged from the ED in good condition.  Filed Vitals:   08/09/13 0002 08/09/13 0041 08/09/13 0157 08/09/13 0159  BP: 123/68 123/68 129/76 126/76  Pulse: 72 60 59 59  Temp:   97.9 F (36.6 C)   TempSrc:   Oral   Resp:   18   Weight:      SpO2: 100%  98%    Results for orders placed during the hospital encounter of 08/08/13  CBC      Result Value Ref Range   WBC 10.1  4.0 - 10.5 K/uL   RBC 5.29  4.22 - 5.81 MIL/uL   Hemoglobin 15.5  13.0 - 17.0 g/dL   HCT 16.145.3  09.639.0 - 04.552.0 %   MCV 85.6  78.0 - 100.0 fL   MCH 29.3  26.0 - 34.0 pg   MCHC 34.2  30.0 - 36.0 g/dL   RDW 40.913.8  81.111.5 - 91.415.5 %   Platelets 275  150 - 400 K/uL  COMPREHENSIVE METABOLIC PANEL      Result Value Ref Range   Sodium 143  137 - 147 mEq/L   Potassium 3.8  3.7 - 5.3 mEq/L   Chloride 101  96 - 112 mEq/L   CO2 26  19 - 32 mEq/L   Glucose, Bld 101 (*) 70 - 99 mg/dL   BUN 8  6 - 23 mg/dL   Creatinine, Ser 7.820.73  0.50 - 1.35 mg/dL   Calcium 9.6  8.4 - 95.610.5 mg/dL   Total Protein 7.6  6.0 - 8.3 g/dL   Albumin 4.5  3.5 - 5.2 g/dL   AST 17  0 - 37 U/L   ALT 16  0 - 53 U/L   Alkaline Phosphatase 40  39 - 117 U/L   Total Bilirubin 0.4  0.3 - 1.2 mg/dL   GFR calc non Af Amer >90  >90 mL/min   GFR calc Af Amer >90  >90 mL/min   Anion gap 16 (*) 5 - 15  ETHANOL      Result Value Ref Range   Alcohol, Ethyl (B) <11  0 - 11 mg/dL  SALICYLATE LEVEL      Result Value Ref Range   Salicylate Lvl <2.0 (*) 2.8 - 20.0 mg/dL  URINE RAPID DRUG SCREEN (HOSP PERFORMED)      Result Value Ref Range   Opiates NONE DETECTED  NONE DETECTED   Cocaine  NONE DETECTED  NONE DETECTED   Benzodiazepines NONE DETECTED  NONE DETECTED   Amphetamines NONE DETECTED  NONE DETECTED   Tetrahydrocannabinol NONE DETECTED  NONE DETECTED   Barbiturates NONE DETECTED  NONE DETECTED    Antony MaduraKelly Bevin Das, PA-C 08/09/13 [phone]

## 2013-08-09 NOTE — Discharge Instructions (Signed)
Alcohol Use Disorder Alcohol use disorder is a mental disorder. It is not a one-time incident of heavy drinking. Alcohol use disorder is the excessive and uncontrollable use of alcohol over time that leads to problems with functioning in one or more areas of daily living. People with this disorder risk harming themselves and others when they drink to excess. Alcohol use disorder also can cause other mental disorders, such as mood and anxiety disorders, and serious physical problems. People with alcohol use disorder often misuse other drugs.  Alcohol use disorder is common and widespread. Some people with this disorder drink alcohol to cope with or escape from negative life events. Others drink to relieve chronic pain or symptoms of mental illness. People with a family history of alcohol use disorder are at higher risk of losing control and using alcohol to excess.  SYMPTOMS  Signs and symptoms of alcohol use disorder may include the following:   Consumption ofalcohol inlarger amounts or over a longer period of time than intended.  Multiple unsuccessful attempts to cutdown or control alcohol use.   A great deal of time spent obtaining alcohol, using alcohol, or recovering from the effects of alcohol (hangover).  A strong desire or urge to use alcohol (cravings).   Continued use of alcohol despite problems at work, school, or home because of alcohol use.   Continued use of alcohol despite problems in relationships because of alcohol use.  Continued use of alcohol in situations when it is physically hazardous, such as driving a car.  Continued use of alcohol despite awareness of a physical or psychological problem that is likely related to alcohol use. Physical problems related to alcohol use can involve the brain, heart, liver, stomach, and intestines. Psychological problems related to alcohol use include intoxication, depression, anxiety, psychosis, delirium, and dementia.   The need for  increased amounts of alcohol to achieve the same desired effect, or a decreased effect from the consumption of the same amount of alcohol (tolerance).  Withdrawal symptoms upon reducing or stopping alcohol use, or alcohol use to reduce or avoid withdrawal symptoms. Withdrawal symptoms include:  Racing heart.  Hand tremor.  Difficulty sleeping.  Nausea.  Vomiting.  Hallucinations.  Restlessness.  Seizures. DIAGNOSIS Alcohol use disorder is diagnosed through an assessment by your health care provider. Your health care provider may start by asking three or four questions to screen for excessive or problematic alcohol use. To confirm a diagnosis of alcohol use disorder, at least two symptoms must be present within a 12-month period. The severity of alcohol use disorder depends on the number of symptoms:  Mild--two or three.  Moderate--four or five.  Severe--six or more. Your health care provider may perform a physical exam or use results from lab tests to see if you have physical problems resulting from alcohol use. Your health care provider may refer you to a mental health professional for evaluation. TREATMENT  Some people with alcohol use disorder are able to reduce their alcohol use to low-risk levels. Some people with alcohol use disorder need to quit drinking alcohol. When necessary, mental health professionals with specialized training in substance use treatment can help. Your health care provider can help you decide how severe your alcohol use disorder is and what type of treatment you need. The following forms of treatment are available:   Detoxification. Detoxification involves the use of prescription medicines to prevent alcohol withdrawal symptoms in the first week after quitting. This is important for people with a history of symptoms   of withdrawal and for heavy drinkers who are likely to have withdrawal symptoms. Alcohol withdrawal can be dangerous and, in severe cases, cause  death. Detoxification is usually provided in a hospital or in-patient substance use treatment facility.  Counseling or talk therapy. Talk therapy is provided by substance use treatment counselors. It addresses the reasons people use alcohol and ways to keep them from drinking again. The goals of talk therapy are to help people with alcohol use disorder find healthy activities and ways to cope with life stress, to identify and avoid triggers for alcohol use, and to handle cravings, which can cause relapse.  Medicines.Different medicines can help treat alcohol use disorder through the following actions:  Decrease alcohol cravings.  Decrease the positive reward response felt from alcohol use.  Produce an uncomfortable physical reaction when alcohol is used (aversion therapy).  Support groups. Support groups are run by people who have quit drinking. They provide emotional support, advice, and guidance. These forms of treatment are often combined. Some people with alcohol use disorder benefit from intensive combination treatment provided by specialized substance use treatment centers. Both inpatient and outpatient treatment programs are available. Document Released: 02/09/2004 Document Revised: 05/18/2013 Document Reviewed: 04/10/2012 Exit[employer] Patient Information 2015 [employer]. This information is not intended to replace advice given to you by your health care provider. Make sure you discuss any questions you have with your health care provider. Alcohol Withdrawal Alcohol withdrawal happens when you normally drink alcohol a lot and suddenly stop drinking. Alcohol withdrawal symptoms can be mild to very bad. Mild withdrawal symptoms can include feeling sick to your stomach (nauseous), headache, or feeling irritable. Bad withdrawal symptoms can include shakiness, being very nervous (anxious), and not thinking clearly.  HOME CARE  Join an alcohol support group.  Stay away from people or  situations that make you want to drink.  Eat a healthy diet. Eat a lot of fresh fruits, vegetables, and lean meats. GET HELP RIGHT AWAY IF:   You become confused. You start to see and hear things that are not really there.  You feel your heart beating very fast.  You throw up (vomit) blood or cannot stop throwing up. This may be bright red or look like black coffee grounds.  You have blood in your poop (stool). This may be bright red, maroon colored, or black and tarry.  You are lightheaded or pass out (faint).  You develop a fever. MAKE SURE YOU:   Understand these instructions.  Will watch your condition.  Will get help right away if you are not doing well or get worse. Document Released: 06/20/2007 Document Revised: 03/26/2011 Document Reviewed: 06/20/2007 Exit[employer] Patient Information 2015 [employer]. This information is not intended to replace advice given to you by your health care provider. Make sure you discuss any questions you have with your health care provider.  

## 2013-08-09 NOTE — ED Notes (Signed)
Tammy SoursGreg, [facility] called to state that he was going to fax outpatient referral information for detox.

## 2013-08-09 NOTE — ED Provider Notes (Signed)
Medical screening examination/treatment/procedure(s) were performed by non-physician practitioner and as supervising physician I was immediately available for consultation/collaboration.   EKG Interpretation None        Hayla Hinger M Clent Damore, MD 08/09/13 0810 

## 2013-08-09 NOTE — ED Provider Notes (Signed)
Medical screening examination/treatment/procedure(s) were performed by non-physician practitioner and as supervising physician I was immediately available for consultation/collaboration.   EKG Interpretation None        Enid SkeensJoshua M Lucylle Foulkes, MD 08/09/13 [phone]

## 2013-08-09 NOTE — BH Assessment (Signed)
Tele Assessment Note   Christian Miranda is an 23 y.o. male who presents at [facility] with the chief desire of outpatient services for his alcohol consumption. Patient reports that he resides with his parents and that his job causes him to become stressed which leads to his drinking. Patient states that he drinks one 18 pack of beer 4-5 times throughout the week and that he now desires to achieve sobriety. Patient reports that he is not experiencing any withdrawal symptoms as he states that he has not consumed any alcohol today. Patient's BAL is <11 on admission. Patient states that his parents, sister, and girlfriend are all positive supports for him and that he desires to manage his dependency "before it gets out of control". Patient denies SI/HI/AVH and reports no previous inpatient treatment for substance abuse. Patient was observed to be alert and oriented x4. Patient presents with euthymic mood and denies symptoms of depression. Writer consulted with Alberteen SamFran Hobson, NP who recommends referral information to be given to patient for outpatient substance abuse providers at this time.   Axis I: Alcohol Use Disorder Axis II: Deferred Axis III:  Past Medical History  Diagnosis Date  . Asthma   . Adolescent behavior problems 08/01/2012   Axis IV: other psychosocial or environmental problems Axis V: 61-70 mild symptoms  Past Medical History:  Past Medical History  Diagnosis Date  . Asthma   . Adolescent behavior problems 08/01/2012    Past Surgical History  Procedure Laterality Date  . Tonsillectomy      Family History: History reviewed. No pertinent family history.  Social History:  reports that he has been smoking Cigarettes.  He has been smoking about 1.50 packs per day. He does not have any smokeless tobacco history on file. He reports that he drinks alcohol. He reports that he does not use illicit drugs.  Additional Social History:  Alcohol / Drug Use History of alcohol / drug use?:  Yes Substance #1 Name of Substance 1: ETOH 1 - Age of First Use: 17 1 - Amount (size/oz): 18-pack of beer 1 - Frequency: daily 1 - Duration: years 1 - Last Use / Amount: 08/07/13- 18 pack of beer  CIWA: CIWA-Ar BP: 123/68 mmHg Pulse Rate: 60 Nausea and Vomiting: no nausea and no vomiting Tactile Disturbances: none Tremor: no tremor Auditory Disturbances: not present Paroxysmal Sweats: no sweat visible Visual Disturbances: not present Anxiety: no anxiety, at ease Headache, Fullness in Head: none present Agitation: normal activity Orientation and Clouding of Sensorium: oriented and can do serial additions CIWA-Ar Total: 0 COWS:    Allergies:  Allergies  Allergen Reactions  . Sulfa Antibiotics Hives    Home Medications:  (Not in a hospital admission)  OB/GYN Status:  No LMP for male patient.  General Assessment Data Location of Assessment: [facility] Services Is this a Tele or Face-to-Face Assessment?: Tele Assessment Is this an Initial Assessment or a Re-assessment for this encounter?: Initial Assessment Living Arrangements: Parent Can pt return to current living arrangement?: Yes Admission Status: Voluntary Is patient capable of signing voluntary admission?: Yes Transfer from: Acute Hospital Referral Source: Self/Family/Friend     [facility] Living Arrangements: Parent Name of Psychiatrist: None Name of Therapist: None  Education Status Is patient currently in school?: No  Risk to self Suicidal Ideation: No Suicidal Intent: No Is patient at risk for suicide?: No Suicidal Plan?: No Access to Means: No What has been your use of drugs/alcohol within the last 12 months?: ETOH  Previous Attempts/Gestures: No How many times?: 0 Other Self Harm Risks: None Triggers for Past Attempts: None known Intentional Self Injurious Behavior: None Family Suicide History: No Depression: No Substance abuse history and/or treatment for substance abuse?:  No Suicide prevention information given to non-admitted patients: Not applicable  Risk to Others Homicidal Ideation: No Thoughts of Harm to Others: No Current Homicidal Intent: No Current Homicidal Plan: No Access to Homicidal Means: No Identified Victim: None History of harm to others?: No Assessment of Violence: None Noted Violent Behavior Description: Pt is calm and cooperative Does patient have access to weapons?: No Criminal Charges Pending?: No Does patient have a court date: No  Psychosis Hallucinations: None noted Delusions: None noted  Mental Status Report Appear/Hygiene: Other (Comment) (Appropriate) Eye Contact: Good Motor Activity: Freedom of movement Speech: Logical/coherent Level of Consciousness: Alert Mood: Pleasant Affect: Appropriate to circumstance Anxiety Level: None Thought Processes: Coherent;Relevant Judgement: Partial Orientation: Person;Place;Time;Situation Obsessive Compulsive Thoughts/Behaviors: None  Cognitive Functioning Concentration: Normal Memory: Recent Intact;Remote Intact IQ: Average Insight: Fair Impulse Control: Poor Appetite: Fair Weight Loss: 0 Weight Gain: 0 Sleep: No Change Total Hours of Sleep: 5 Vegetative Symptoms: None  ADLScreening [facility] Assessment Services) Patient's cognitive ability adequate to safely complete daily activities?: Yes Patient able to express need for assistance with ADLs?: Yes Independently performs ADLs?: Yes (appropriate for developmental age)  Prior Inpatient Therapy Prior Inpatient Therapy: No  Prior Outpatient Therapy Prior Outpatient Therapy: No  ADL Screening (condition at time of admission) Patient's cognitive ability adequate to safely complete daily activities?: Yes Is the patient deaf or have difficulty hearing?: No Does the patient have difficulty seeing, even when wearing glasses/contacts?: No Does the patient have difficulty concentrating, remembering, or making decisions?:  No Patient able to express need for assistance with ADLs?: Yes Does the patient have difficulty dressing or bathing?: No Independently performs ADLs?: Yes (appropriate for developmental age) Does the patient have difficulty walking or climbing stairs?: No Weakness of Legs: None Weakness of Arms/Hands: None  Home Assistive Devices/Equipment Home Assistive Devices/Equipment: None  Therapy Consults (therapy consults require a physician order) PT Evaluation Needed: No OT Evalulation Needed: No SLP Evaluation Needed: No Abuse/Neglect Assessment (Assessment to be complete while patient is alone) Physical Abuse: Denies Verbal Abuse: Denies Sexual Abuse: Denies Exploitation of patient/patient's resources: Denies Self-Neglect: Denies Values / Beliefs Cultural Requests During Hospitalization: None Spiritual Requests During Hospitalization: None Consults Spiritual Care Consult Needed: No Social Work Consult Needed: No      Additional Information 1:1 In Past 12 Months?: No CIRT Risk: No Elopement Risk: No Does patient have medical clearance?: Yes     Disposition: Writer consulted with Alberteen Sam, NP who recommends referral information to be given to patient for outpatient substance abuse providers at this time.   Disposition Initial Assessment Completed for this Encounter: Yes Disposition of Patient: Outpatient treatment Type of outpatient treatment: Chemical Dependence - Intensive Outpatient  Haskel Khan 08/09/2013 12:42 AM

## 2013-08-09 NOTE — ED Notes (Signed)
Telepsych being performed at bedside

## 2013-08-09 NOTE — BH Assessment (Addendum)
[facility] Assessment Progress Note  Writer consulted with Alberteen SamFran Hobson, NP who recommends referral information to be given to patient for outpatient substance abuse providers at this time.   Writer notified Selena BattenKim, RN and [place], [place] of disposition.    Janann ColonelGregory Pickett Jr. MSW, LCSW Therapeutic Triage Services-Triage Specialist   Phone: [phone] Fax: [phone]

## 2013-11-06 ENCOUNTER — Encounter (HOSPITAL_COMMUNITY): Payer: Self-pay | Admitting: Emergency Medicine

## 2013-11-06 ENCOUNTER — Emergency Department (HOSPITAL_COMMUNITY)
Admission: EM | Admit: 2013-11-06 | Discharge: 2013-11-06 | Disposition: A | Discharge status: Home / Self Care | Payer: 59 | Attending: Emergency Medicine | Admitting: Emergency Medicine

## 2013-11-06 DIAGNOSIS — F1010 Alcohol abuse, uncomplicated: Secondary | ICD-10-CM

## 2013-11-06 DIAGNOSIS — J45909 Unspecified asthma, uncomplicated: Secondary | ICD-10-CM | POA: Insufficient documentation

## 2013-11-06 DIAGNOSIS — Z720 Tobacco use: Secondary | ICD-10-CM | POA: Diagnosis not present

## 2013-11-06 LAB — COMPREHENSIVE METABOLIC PANEL
ALK PHOS: 39 U/L (ref 39–117)
ALT: 12 U/L (ref 0–53)
ANION GAP: 12 (ref 5–15)
AST: 14 U/L (ref 0–37)
Albumin: 4.4 g/dL (ref 3.5–5.2)
BUN: 7 mg/dL (ref 6–23)
CO2: 25 mEq/L (ref 19–32)
CREATININE: 0.71 mg/dL (ref 0.50–1.35)
Calcium: 9.5 mg/dL (ref 8.4–10.5)
Chloride: 104 mEq/L (ref 96–112)
GFR calc non Af Amer: >90 mL/min (ref >90)
GLUCOSE: 89 mg/dL (ref 70–99)
POTASSIUM: 4.3 meq/L (ref 3.7–5.3)
Sodium: 141 mEq/L (ref 137–147)
TOTAL PROTEIN: 7.7 g/dL (ref 6.0–8.3)
Total Bilirubin: 0.4 mg/dL (ref 0.3–1.2)

## 2013-11-06 LAB — PHOSPHORUS: Phosphorus: 5.1 mg/dL — ABNORMAL HIGH (ref 2.3–4.6)

## 2013-11-06 LAB — CBC WITH DIFFERENTIAL/PLATELET
BASOS PCT: 0 % (ref 0–1)
Basophils Absolute: 0 10*3/uL (ref 0.0–0.1)
Eosinophils Absolute: 0.1 10*3/uL (ref 0.0–0.7)
Eosinophils Relative: 1 % (ref 0–5)
HCT: 48.9 % (ref 39.0–52.0)
Hemoglobin: 16.7 g/dL (ref 13.0–17.0)
LYMPHS PCT: 31 % (ref 12–46)
Lymphs Abs: 2.3 10*3/uL (ref 0.7–4.0)
MCH: 29.6 pg (ref 26.0–34.0)
MCHC: 34.2 g/dL (ref 30.0–36.0)
MCV: 86.5 fL (ref 78.0–100.0)
MONO ABS: 0.7 10*3/uL (ref 0.1–1.0)
MONOS PCT: 9 % (ref 3–12)
Neutro Abs: 4.3 10*3/uL (ref 1.7–7.7)
Neutrophils Relative %: 59 % (ref 43–77)
PLATELETS: 249 10*3/uL (ref 150–400)
RBC: 5.65 MIL/uL (ref 4.22–5.81)
RDW: 13.9 % (ref 11.5–15.5)
WBC: 7.3 10*3/uL (ref 4.0–10.5)

## 2013-11-06 LAB — RAPID URINE DRUG SCREEN, HOSP PERFORMED
Amphetamines: NOT DETECTED
Barbiturates: NOT DETECTED
Benzodiazepines: NOT DETECTED
Cocaine: NOT DETECTED
Opiates: NOT DETECTED
TETRAHYDROCANNABINOL: NOT DETECTED

## 2013-11-06 LAB — MAGNESIUM: Magnesium: 2.3 mg/dL (ref 1.5–2.5)

## 2013-11-06 LAB — ETHANOL

## 2013-11-06 NOTE — BH Assessment (Signed)
[facility] Assessment Progress Note  Spoke with Dr. Micheline Mazeocherty, who does not request TTS consult, but did request OP resources.  Writer called [facility] CD IOP program and got an intake appt for him for the CD-IOP program at 8:45 next Monday am.  EDP, Mom and pt agree with disposition.  Gave pt written information.

## 2013-11-06 NOTE — ED Provider Notes (Signed)
CSN: [id number]     Arrival date & time 11/06/13  1054 History   First MD Initiated Contact with Patient 11/06/13 1126     Chief Complaint  Patient presents with  . ETOH Detox      (Consider location/radiation/quality/duration/timing/severity/associated sxs/prior Treatment) HPI Comments: He was seen in the ED recently for the same and was not felt to meet criteria for inpatient treatment. He was given resources for AA but did not followup as instructed. He reports he feels he needs to get in a group.  Patient is a 23 y.o. male presenting with alcohol problem. The history is provided by the patient. No language interpreter was used.  Alcohol Problem This is a chronic problem. The current episode started more than 1 week ago. The problem occurs constantly. The problem has not changed since onset.Pertinent negatives include no chest pain, no abdominal pain, no headaches and no shortness of breath. Nothing aggravates the symptoms. Nothing relieves the symptoms. He has tried nothing for the symptoms. The treatment provided no relief.    Past Medical History  Diagnosis Date  . Asthma   . Adolescent behavior problems 08/01/2012   Past Surgical History  Procedure Laterality Date  . Tonsillectomy     History reviewed. No pertinent family history. History  Substance Use Topics  . Smoking status: Current Every Day Smoker -- 1.50 packs/day    Types: Cigarettes  . Smokeless tobacco: Not on file     Comment: smokes one cigarette/day. trying to quit  . Alcohol Use: Yes     Comment: Patient reports he consumes 18 pack of beer daily.     Review of Systems  Constitutional: Negative for fever, activity change, appetite change and fatigue.  HENT: Negative for congestion, facial swelling, rhinorrhea and trouble swallowing.   Eyes: Negative for photophobia and pain.  Respiratory: Negative for cough, chest tightness and shortness of breath.   Cardiovascular: Negative for chest pain and leg  swelling.  Gastrointestinal: Negative for nausea, vomiting, abdominal pain, diarrhea and constipation.  Endocrine: Negative for polydipsia and polyuria.  Genitourinary: Negative for dysuria, urgency, decreased urine volume and difficulty urinating.  Musculoskeletal: Negative for back pain and gait problem.  Skin: Negative for color change, rash and wound.  Allergic/Immunologic: Negative for immunocompromised state.  Neurological: Negative for dizziness, facial asymmetry, speech difficulty, weakness, numbness and headaches.  Psychiatric/Behavioral: Negative for confusion, decreased concentration and agitation.      Allergies  Sulfa antibiotics  Home Medications   Prior to Admission medications   Not on File   BP 118/73  Pulse 81  Temp(Src) 98.1 F (36.7 C) (Oral)  Resp 16  SpO2 99% Physical Exam  Constitutional: He is oriented to person, place, and time. He appears well-developed and well-nourished. No distress.  HENT:  Head: Normocephalic and atraumatic.  Mouth/Throat: No oropharyngeal exudate.  Eyes: Pupils are equal, round, and reactive to light.  Neck: Normal range of motion. Neck supple.  Cardiovascular: Normal rate, regular rhythm and normal heart sounds.  Exam reveals no gallop and no friction rub.   No murmur heard. Pulmonary/Chest: Effort normal and breath sounds normal. No respiratory distress. He has no wheezes. He has no rales.  Abdominal: Soft. Bowel sounds are normal. He exhibits no distension and no mass. There is no tenderness. There is no rebound and no guarding.  Musculoskeletal: Normal range of motion. He exhibits no edema and no tenderness.  Neurological: He is alert and oriented to person, place, and time.  Skin: Skin is  warm and dry.  Psychiatric: He has a normal mood and affect.    ED Course  Procedures (including critical care time) Labs Review Labs Reviewed  PHOSPHORUS - Abnormal; Notable for the following:    Phosphorus 5.1 (*)    All other  components within normal limits  CBC WITH DIFFERENTIAL  COMPREHENSIVE METABOLIC PANEL  ETHANOL  MAGNESIUM  URINE RAPID DRUG SCREEN (HOSP PERFORMED)    Imaging Review No results found.   EKG Interpretation None      MDM   Final diagnoses:  Alcohol abuse    Pt is a 23 y.o. male with Pmhx as above who presents with several months of heavy alcohol abuse. Patient reports drinking a 2 cm 18 beers a day after he gets off of work. His last drink was last night. He denies any concurrent drug use. He denies depression, SI/HI. He has no history of withdrawal symptoms in the past. He denies current anxiety, palpitations, diaphoresis, tremors. He is here with his mother requesting help establishing an alcohol treatment program. He is not currently interested in staying for inpatient treatment. The patient has been medically cleared through the emergency department, resources given by myself and social work. He has been scheduled for a appointment on Monday at 8:45 AM for initiation of intensive outpatient treatment services with behavioral health.        Toy CookeyMegan Shawonda Kerce, MD 11/06/13 1714

## 2013-11-06 NOTE — Discharge Instructions (Signed)
Finding Treatment for Alcohol and Drug Addiction It can be hard to find the right place to get professional treatment. Here are some important things to consider:  There are different types of treatment to choose from.  Some programs are live-in (residential) while others are not (outpatient). Sometimes a combination is offered.  No single type of program is right for everyone.  Most treatment programs involve a combination of education, counseling, and a 12-step, spiritually-based approach.  There are non-spiritually based programs (not 12-step).  Some treatment programs are government sponsored. They are geared for patients without private insurance.  Treatment programs can vary in many respects such as:  Cost and types of insurance accepted.  Types of on-site medical services offered.  Length of stay, setting, and size.  Overall philosophy of treatment. A person may need specialized treatment or have needs not addressed by all programs. For example, adolescents need treatment appropriate for their age. Other people have secondary disorders that must be managed as well. Secondary conditions can include mental illness, such as depression or diabetes. Often, a period of detoxification from alcohol or drugs is needed. This requires medical supervision and not all programs offer this. THINGS TO CONSIDER WHEN SELECTING A TREATMENT PROGRAM   Is the program certified by the appropriate government agency? Even private programs must be certified and employ certified professionals.  Does the program accept your insurance? If not, can a payment plan be set up?  Is the facility clean, organized, and well run? Do they allow you to speak with graduates who can share their treatment experience with you? Can you tour the facility? Can you meet with staff?  Does the program meet the full range of individual needs?  Does the treatment program address sexual orientation and physical disabilities?  Do they provide age, gender, and culturally appropriate treatment services?  Is treatment available in languages other than English?  Is long-term aftercare support or guidance encouraged and provided?  Is assessment of an individual's treatment plan ongoing to ensure it meets changing needs?  Does the program use strategies to encourage reluctant patients to remain in treatment long enough to increase the likelihood of success?  Does the program offer counseling (individual or group) and other behavioral therapies?  Does the program offer medicine as part of the treatment regimen, if needed?  Is there ongoing monitoring of possible relapse? Is there a defined relapse prevention program? Are services or referrals offered to family members to ensure they understand addiction and the recovery process? This would help them support the recovering individual.  Are 12-step meetings held at the center or is transport available for patients to attend outside meetings? In countries outside of the [place] and [place], see local directories for contact information for services in your area. Document Released: 11/30/2004 Document Revised: 03/26/2011 Document Reviewed: 06/12/2007 ExitCare Patient Information 2015 [employer]. This information is not intended to replace advice given to you by your health care provider. Make sure you discuss any questions you have with your health care provider.  

## 2013-11-06 NOTE — ED Notes (Addendum)
Pt presents wanting ETOH detox.  Sts ETOH abuse x 5-6 years.  Pt reports drinking a 18 pack of beer per day.  Last drink, around 0200 or 0300 this morning.  Denies SI/HI/Hallucinations.  Pt reports previous detox attempt w/o complications.  Pt reports "I realized that I can't do it on my own."  A & Ox4.

## 2013-11-06 NOTE — Progress Notes (Signed)
  CARE MANAGEMENT ED NOTE 11/06/2013  Patient:  Christian Miranda,[name]   Account Number:  [id number]  Date Initiated:  11/06/2013  Documentation initiated by:  Edd ArbourGIBBS,Dayquan Buys  Subjective/Objective Assessment:   23 yr old [employer] [place] wanting ETOH detox.  Sts ETOH abuse x 5-6 years.  Pt reports drinking a 18 pack of beer per day.  Last drink, around 0200 or 0300 this morning.  Denies SI/HI/Hallucinations.     Subjective/Objective Assessment Detail:   Pt reports previous detox attempt w/o complications.  Pt reports "I realized that I can't do it on my own."  Confirms no pcp  Mother at bedside answering most of questions Reports pt has not seen a pcp since pediatrician and needs new united health care doctor She states she will try to get pt in to see her provider if needed since her provider is a united health care provider  Pt nodding his head during interaction with a frown noted     Action/Plan:   Pt without pcp see notes below   Action/Plan Detail:   Anticipated DC Date:  11/06/2013     Status Recommendation to Physician:   Result of Recommendation:    Other ED Services  Consult Working Plan    DC Planning Services  Other  PCP issues  Outpatient Services - Pt will follow up    Choice offered to / List presented to:            Status of service:  Completed, signed off  ED Comments:   ED Comments Detail:  [facility] ED CM spoke with pt on how to obtain an in network pcp with insurance coverage via the customer service number or web site Cm reviewed ED level of care for crisis/emergent services and community pcp level of care to manage continuous or chronic medical concerns.  The pt voiced understanding CM encouraged pt and discussed pt's responsibility to verify with pt's insurance carrier that any recommended medical provider offered by any emergency room or a hospital provider is within the carrier's network. The pt voiced understanding

## 2013-11-06 NOTE — ED Notes (Signed)
Pt sts that he is unable to urinate at this time. Pt is drinking water at this time

## 2013-11-06 NOTE — ED Notes (Addendum)
Pt sts that he drinks 18 beers/day, everyday with his last drink being at 0300. Pt denies pain, SI or HI. Pt is A&O and in NAD. Pt mother at bedside.

## 2013-11-06 NOTE — ED Notes (Signed)
Pt and mother request approx time in ED until they will find out what can be done. Dr Micheline Mazeocherty notified and sts that she will come talk to pt. Pt and mother notified

## 2013-11-06 NOTE — ED Notes (Signed)
Pt. Is unable to use the restroom at this time, but is aware that we need a urine specimen.  

## 2013-11-09 ENCOUNTER — Other Ambulatory Visit (HOSPITAL_COMMUNITY): Payer: 59

## 2013-11-09 ENCOUNTER — Encounter (HOSPITAL_COMMUNITY): Payer: Self-pay | Admitting: Psychology

## 2013-11-09 DIAGNOSIS — J45909 Unspecified asthma, uncomplicated: Secondary | ICD-10-CM | POA: Insufficient documentation

## 2013-11-09 DIAGNOSIS — F42 Obsessive-compulsive disorder: Secondary | ICD-10-CM | POA: Insufficient documentation

## 2013-11-09 DIAGNOSIS — F4320 Adjustment disorder, unspecified: Secondary | ICD-10-CM | POA: Insufficient documentation

## 2013-11-09 DIAGNOSIS — F1020 Alcohol dependence, uncomplicated: Secondary | ICD-10-CM | POA: Insufficient documentation

## 2013-11-09 DIAGNOSIS — F17210 Nicotine dependence, cigarettes, uncomplicated: Secondary | ICD-10-CM | POA: Insufficient documentation

## 2013-11-11 ENCOUNTER — Other Ambulatory Visit (HOSPITAL_COMMUNITY): Payer: 59 | Attending: Psychiatry | Admitting: Psychology

## 2013-11-11 DIAGNOSIS — F10230 Alcohol dependence with withdrawal, uncomplicated: Secondary | ICD-10-CM
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
## 2013-11-12 ENCOUNTER — Encounter (HOSPITAL_COMMUNITY): Payer: Self-pay | Admitting: Psychology

## 2013-11-12 DIAGNOSIS — F10230 Alcohol dependence with withdrawal, uncomplicated: Secondary | ICD-10-CM | POA: Insufficient documentation

## 2013-11-13 ENCOUNTER — Other Ambulatory Visit (HOSPITAL_COMMUNITY): Payer: 59 | Admitting: Psychology

## 2013-11-13 ENCOUNTER — Encounter (HOSPITAL_COMMUNITY): Payer: Self-pay

## 2013-11-13 DIAGNOSIS — F4310 Post-traumatic stress disorder, unspecified: Secondary | ICD-10-CM | POA: Insufficient documentation

## 2013-11-13 DIAGNOSIS — F4381 Prolonged grief disorder: Secondary | ICD-10-CM

## 2013-11-13 DIAGNOSIS — F4329 Adjustment disorder with other symptoms: Secondary | ICD-10-CM

## 2013-11-13 DIAGNOSIS — F4320 Adjustment disorder, unspecified: Secondary | ICD-10-CM | POA: Diagnosis not present

## 2013-11-13 DIAGNOSIS — F42 Obsessive-compulsive disorder: Secondary | ICD-10-CM | POA: Diagnosis not present

## 2013-11-13 DIAGNOSIS — F17210 Nicotine dependence, cigarettes, uncomplicated: Secondary | ICD-10-CM | POA: Diagnosis not present

## 2013-11-13 DIAGNOSIS — F1020 Alcohol dependence, uncomplicated: Secondary | ICD-10-CM | POA: Diagnosis not present

## 2013-11-13 DIAGNOSIS — J45909 Unspecified asthma, uncomplicated: Secondary | ICD-10-CM | POA: Diagnosis not present

## 2013-11-13 DIAGNOSIS — Z8659 Personal history of other mental and behavioral disorders: Secondary | ICD-10-CM

## 2013-11-13 DIAGNOSIS — F10230 Alcohol dependence with withdrawal, uncomplicated: Secondary | ICD-10-CM
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
## 2013-11-13 NOTE — Progress Notes (Signed)
    Daily Group Progress Note  Program: CD-IOP   Group Time: 1-2:30 pm  Participation Level: Active  Behavioral Response: Appropriate and Sharing  Type of Therapy: Process Group  Topic: Process: the first part of group was spent in process. Member s shared about issues, concerns and activities in early recovery. In particular, members were asked to share what had occurred since we last met in group on Monday. A new member was present and he introduced himself. Drug tests were collected from one member who had been absent on Monday and the new group member. Drug test results were returned from tests collected on Monday.   Group Time: 2:45- 4pm  Participation Level: Minimal  Behavioral Response: Appropriate  Type of Therapy: Psycho-education Group  Topic: Psycho-Ed: the second half of group was spent in a psycho-ed. A brief TED talk with Almond Lint was viewed and her topic was Vulnerability.  The emphasis of the talk was the essential importance of being vulnerable in establishing 'connection' with others. The group discussed the topic and then engaged in an exercise. All group members were provided a paper mask. They wrote words on the outside that identified how they sought to represent themselves while on the inside, group members wrote words that display what they really feel, but what others might not be aware of.  Later, other members wrote a word that represent how the member presents to them. The session concluded with a discussion of all these different elements. The session was revealing and compelling.   Summary: The patient was new to the group today. He was attentive and when I asked him to share about himself, he described why he had sought this treatment program. The patient described a growing use of alcohol and behaviors that became erratic, threatening and physically aggressive. He referred to these times as "episodes". He explained he had decided to spend his time  differently, including taking up the banjo again, reading and engaging in other hobbies that he had stopped when his drinking increased. He reported he has been working almost 70 hours per week and intends to work before and after group in order to maintain that same level of productivity. He was welcomed by his fellow group members. In the second half of group, the patient described himself as: hard working and insightful. He appeared relaxed throughout the session and open about himself. There is work to be done, but he responded well to this first intervention. The patient's reported sobriety date is 10/24.     Family Program: Family present? No   Name of family member(s):   UDS collected: Yes Results: not back from lab  AA/NA attended?: No, but patient is new to the program  Sponsor?: No   Natash Berman, LCAS

## 2013-11-16 ENCOUNTER — Other Ambulatory Visit (HOSPITAL_COMMUNITY): Payer: 59 | Attending: Psychiatry | Admitting: Psychology

## 2013-11-16 DIAGNOSIS — F4310 Post-traumatic stress disorder, unspecified: Secondary | ICD-10-CM | POA: Insufficient documentation

## 2013-11-16 DIAGNOSIS — J45909 Unspecified asthma, uncomplicated: Secondary | ICD-10-CM | POA: Insufficient documentation

## 2013-11-16 DIAGNOSIS — F1020 Alcohol dependence, uncomplicated: Secondary | ICD-10-CM | POA: Insufficient documentation

## 2013-11-16 DIAGNOSIS — F17210 Nicotine dependence, cigarettes, uncomplicated: Secondary | ICD-10-CM | POA: Diagnosis not present

## 2013-11-16 DIAGNOSIS — F42 Obsessive-compulsive disorder: Secondary | ICD-10-CM | POA: Insufficient documentation

## 2013-11-16 DIAGNOSIS — F4329 Adjustment disorder with other symptoms: Secondary | ICD-10-CM | POA: Insufficient documentation

## 2013-11-16 DIAGNOSIS — F10230 Alcohol dependence with withdrawal, uncomplicated: Secondary | ICD-10-CM

## 2013-11-16 NOTE — Progress Notes (Signed)
    Daily Group Progress Note  Program: CD-IOP   Group Time: 1-2:30 pm  Participation Level: Active  Behavioral Response: Appropriate and Sharing  Type of Therapy: Process Group  Topic: Process: the first part of group was spent in process. Members shared about current issues and challenges in early recovery. They identified meetings they had gone to or work with sponsor or others in recovery. During this group session, the medical director, CK, met with new group members for their initial assessment and current members to address medication needs.   Group Time: 2:45- 4pm  Participation Level: Minimal  Behavioral Response: Sharing  Type of Therapy: Psycho-education Group  Topic: Psycho-Ed: the second half of group was spent in a psycho-ed. The emphasis on this educational session was the need to be in the moment, "now". Handouts were provided and read by members. The difficulties and challenges of being in the present moment were discussed. Most group members admitted they would have difficulties sitting on the couch for 10 minutes without doing anything, but being present to their feelings. The benefits to be experienced by focusing on the moment, on today, were also identified. There was good discussion and feedback about this most important, but difficult aspect of recovery.   Summary: The patient reported he was doing okay. He has been working a lot and his co-workers are very supportive of his recovery. They have removed all of the alcohol from around the office and he is very appreciative of that. I pointed out they must value him highly to do this. The patient reported he realized he might not be able to attend AA meetings during the week because of his work schedule, but he wondered if he could go to them on weekends. Group members shared about the weekend meetings and also answered his questions about getting a sponsor. The patient met with the medical director during the second  half of group, but returned as the session was ending. He responded well to this intervention and display good motivation. His sobriety date is 10/24.     Family Program: Family present? No   Name of family member(s):   UDS collected: Yes Results: negative  AA/NA attended?: No, but he is new to the program  Sponsor? No   Birt Reinoso, LCAS

## 2013-11-17 ENCOUNTER — Encounter (HOSPITAL_COMMUNITY): Payer: Self-pay | Admitting: Psychology

## 2013-11-17 NOTE — Progress Notes (Addendum)
    Daily Group Progress Note  Program: CD-IOP   Group Time: 1-2:30 pm  Participation Level: Active  Behavioral Response: Appropriate and Sharing  Type of Therapy: Process Group  Topic: Group Process/Psycho-Ed: the first part of group was spent in process. Members shared about the past weekend and any events or issues that may have presented themselves. All members checked-in with their same sobriety dates. A follow up ensued about the importance of learning new ways of "Self-Soothing", with a handout on the five senses and ways to comfort one's self. Members listed and then shared the ways they find comfort or pleasure through each of their five senses. There was good feedback and disclosure among group members.   Group Time: 2:45- 4pm  Participation Level: Active  Behavioral Response: Sharing  Type of Therapy: Psycho-education Group  Topic: Activity: Heart Math. The second part of group was spent in an activity. A presentation on Heart Math was provided. Group members were invited to 'hook up' with the Heart Math monitor and practice their breath. Three group members volunteered. The session proved very insightful and members shared their thoughts and a willingness to practice their breathing. Drug tests were collected from all group members present today.  Summary: The patient reported he had a great weekend. His parents had surprised him and took him and his girlfriend to the mountains for the weekend. He had really enjoyed the time together with his family. The patient reported he was going to go to his first AA meeting tonight. Another member encouraged him to not judge all meetings by his first one. The patient was able to describe with great detail how he soothed himself through his five senses. He was very articulate and responded well to the intervention. The patient was quiet, but attentive during Heart Math. He seems to be doing well and trying hard in these first few sessions.  His sobriety date remains 10/24.    Family Program: Family present? No   Name of family member(s):   UDS collected: Yes Results: not back yet  AA/NA attended?: No  Sponsor?: No, he has just begun the program   Keeara Frees, LCAS

## 2013-11-18 ENCOUNTER — Other Ambulatory Visit (HOSPITAL_COMMUNITY): Payer: 59 | Admitting: Psychology

## 2013-11-18 DIAGNOSIS — F1020 Alcohol dependence, uncomplicated: Secondary | ICD-10-CM | POA: Diagnosis not present

## 2013-11-18 DIAGNOSIS — F10230 Alcohol dependence with withdrawal, uncomplicated: Secondary | ICD-10-CM

## 2013-11-18 DIAGNOSIS — F4381 Prolonged grief disorder: Secondary | ICD-10-CM

## 2013-11-18 DIAGNOSIS — F4329 Adjustment disorder with other symptoms: Secondary | ICD-10-CM

## 2013-11-19 ENCOUNTER — Encounter (HOSPITAL_COMMUNITY): Payer: Self-pay | Admitting: Psychology

## 2013-11-19 NOTE — Progress Notes (Signed)
    Daily Group Progress Note  Program: CD-IOP   Group Time: 1-2:30 pm  Participation Level: Active  Behavioral Response: Appropriate and Sharing  Type of Therapy: Process Group  Topic: After checking in, group members each shared about how the last couple of days have been, meetings they attended, and any obstacles to recovery that have presented themselves.  One new group member was in attendance and took the time to introduce himself to the group and tell about what has led him to the program.  Cravings and triggers were discussed, as well as the four-step process to relapse and ways that one could intervene before they return to using.  Drug tests were collected from the new group member and 2 others who had been absent from the last session.  Group Time: 2:45- 4pm  Participation Level: Active  Behavioral Response: Sharing  Type of Therapy: Psycho-education Group  Topic: The second part of group was spent discussing the concept of self-compassion.  Group members completed a self-compassion assessment, and participated in discussion about the meaning of self-compassion and how they can implement the concept in their lives.  The group members were asked to write a compassionate letter to their selves as an assignment before the next group meeting.  At the end of group, a patient read his "Step One" packet to the group.    Summary: The patient reported that he had attended his first AA meeting, but that he did not enjoy it due to the fact that it was small and unorganized.  He plans on trying another meeting tonight.  He hopes to get a starter chip at his next meeting, because they were not available at the meeting he attended yesterday.  The patient stated that he has also been working over-time the past couple of days.  The patient appears relaxed and comfortable in this group setting. He responded well to this intervention and his sobriety date remains 10/24.   Family Program:  Family present? No   Name of family member(s):   UDS collected: No Results:  AA/NA attended?: OmanesMonday  Sponsor?: No, but he is new to the program   Climmie Cronce, LCAS

## 2013-11-20 ENCOUNTER — Other Ambulatory Visit (HOSPITAL_COMMUNITY): Payer: 59 | Admitting: Psychology

## 2013-11-20 DIAGNOSIS — F1020 Alcohol dependence, uncomplicated: Secondary | ICD-10-CM | POA: Diagnosis not present

## 2013-11-20 DIAGNOSIS — F10230 Alcohol dependence with withdrawal, uncomplicated: Secondary | ICD-10-CM

## 2013-11-23 ENCOUNTER — Other Ambulatory Visit (HOSPITAL_COMMUNITY): Payer: 59 | Admitting: Psychology

## 2013-11-23 DIAGNOSIS — F1020 Alcohol dependence, uncomplicated: Secondary | ICD-10-CM | POA: Diagnosis not present

## 2013-11-23 DIAGNOSIS — F10230 Alcohol dependence with withdrawal, uncomplicated: Secondary | ICD-10-CM

## 2013-11-24 ENCOUNTER — Encounter (HOSPITAL_COMMUNITY): Payer: Self-pay | Admitting: Psychology
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
## 2013-11-24 NOTE — Progress Notes (Signed)
    Daily Group Progress Note  Program: CD-IOP   Group Time: 1-2:30 pm  Participation Level: Active  Behavioral Response: Appropriate and Sharing  Type of Therapy: Process Group  Topic: Process:  The first part of group was spent in process. Members shared about issues and concerns they are facing in early recovery. A new group member was present today and she introduced herself to the group and told everyone a little bit about herself. During group today, the medical director, Darlyne Russian, met with new group members, followed up with current members and completed a discharge.   Group Time: 2:45- 4pm  Participation Level: Active  Behavioral Response: Sharing  Type of Therapy: Psycho-education Group  Topic: Psycho-Ed: The second half of group was spent discussing a handout on goals for members of a group like this one. The importance of being honest and as transparent as possible was emphasized. Members offered feedback and experiences in the past and their struggles with being open and vulnerable. Drug test results were returned and 2 members were asked to address the positive results. While one member admitted taking a Xanax he had found, the other member denied any knowledge of her positive opiate result. Near the conclusion of the session, a graduation ceremony was held to honor a member who was successfully graduating. Members shared kind words of hope and well-wishes with the graduating member and it was a powerful ending to this group session and the week.   Summary: The patient reported he had attended an [place] meeting on Wednesday night at [facility] in [place]. It had been much more pleasant and validating than his first [place] meeting. The patient reported he had even secured a sponsor. They have a lot in common, including their musical interests. The patient explained that his sponsor is a good banjo player and he has just begun learning how to play. He reported he was very  pleased about having found such a good guy. "We have a lot in common", the patient reported. The patient had kind words for the graduating member and admitted that he was disappointed that he was leaving after they just met. He made some good comments and responded well to this intervention. The patient has done everything we have asked of him and is making good progress in his very early recovery. His sobriety date remains 10/24.    Family Program: Family present? No   Name of family member(s):   UDS collected: No Results:   AA/NA attended?: YesWednesday  Sponsor?: Yes   Gaylin Osoria, LCAS

## 2013-11-25 ENCOUNTER — Other Ambulatory Visit (HOSPITAL_COMMUNITY): Payer: 59 | Admitting: Psychology

## 2013-11-25 ENCOUNTER — Encounter (HOSPITAL_COMMUNITY): Payer: Self-pay | Admitting: Psychology

## 2013-11-25 DIAGNOSIS — F1020 Alcohol dependence, uncomplicated: Secondary | ICD-10-CM | POA: Diagnosis not present

## 2013-11-26 ENCOUNTER — Encounter (HOSPITAL_COMMUNITY): Payer: Self-pay | Admitting: Psychology

## 2013-11-26 NOTE — Progress Notes (Signed)
    Daily Group Progress Note  Program: CD-IOP   Group Time: 1-2:30 pm  Participation Level: Active  Behavioral Response: Sharing  Type of Therapy: Psycho-education Group  Topic: Psycho-Ed: the first part of group was spent in a psycho-ed. A visitor from the Chaplaincy Program came to speak. Donnelly Stagerlexis Smith introduced herself and proceeded to talk about the roles of family members in an alcoholic household. She provided large posters with pictures and descriptions of the different roles. Members were invited to identify how these roles had or continue to influence their lives. Group members shared about how these roles might continue to play out. The disclosures invited more feedback and in-depth sharing among the group.   Group Time: 2:45- 4pm  Participation Level: Active  Behavioral Response: Appropriate and Sharing  Type of Therapy: Process Group  Topic: Process/Graduation: the second part of group was spent in process. Members disclosed about issues and concerns in early recovery. During this session, members were provided with the results of the most recent drug tests. Two results had been positive. Those two members were asked about the results and they both admitted having used. They had both denied any use during the previous session. Their previous denials were discussed at length. As the session neared the end, a graduation ceremony was held for a successfully graduating member. Group members shared kind words of hope and encouragement.   Summary: During the psycho-ed, the patient identified himself as the "mascot". He explained that his role "tells him not to display my feelings". The patient admitted that he has considered it a weakness. The chaplain questioned what he would have to do to address these beliefs? She wondered whether he could forgive himself? The patient made some good comments during this session and provided good support for fellow members. In process, the patient  reported he was exhausted. [facility] 2 days. He patient reported his uncle had fallen from a deer stand he was setting up. He had fallen 40 feet straight down. The patient reported he had shattered his legs beneath the knees and was suffering from blood clots as a result of the damage. The patient shared that it was very hard to see this man who had been so strong be so weakened. The patient made good comments during the session, but the pain of his uncle's trauma was clearly weighing heavily on him. His sobriety date is 10/24.    Family Program: Family present? No   Name of family member(s):   UDS collected: No Results:   AA/NA attended?: YesTuesday  Sponsor?: Yes   Keithon Mccoin, LCAS

## 2013-11-27 ENCOUNTER — Other Ambulatory Visit (HOSPITAL_COMMUNITY): Payer: 59 | Admitting: Psychology

## 2013-11-27 DIAGNOSIS — F10230 Alcohol dependence with withdrawal, uncomplicated: Secondary | ICD-10-CM

## 2013-11-27 DIAGNOSIS — F1020 Alcohol dependence, uncomplicated: Secondary | ICD-10-CM | POA: Diagnosis not present

## 2013-11-27 NOTE — Progress Notes (Unsigned)
Christian Miranda is a 23 y.o. male patient. CD-IOP:  Treatment Planning Session. I met with the patient after group today for his first individual session. The patient has attended 4 group sessions, having sought treatment to address his alcohol dependence. The patient was agreeable in identifying his goals for treatment. He confirmed his sobriety is his # 1 goal of treatment. He wants to learn to live abstinent from alcohol and learn a different way to cope with his feelings besides drinking alcohol. He understands he cannot do this alone and desires to acquire the necessary skills to maintain long term sobriety. He also wants to increase his knowledge of addiction and recovery; he has family history of addiction with both maternal and paternal great-grandparents. The patient is open to 12-step recovery and has been to meetings and has a temporary sponsor. [place] the number of 12-step meetings is limited but hopes to find more meetings in the nearby town of [place]. He also has gotten a temporary sponsor who is an [place] and hopes to teach the patient how to play the banjo. The patient has family support but they are limited in their knowledge of addiction which will be addressed during family sessions. The treatment plan was reviewed and signed and responded well to the intervention. His sobriety date remains 10/24. The patient responded well to this intervention and is making good progress in early recovery. We will continue with weekly individual sessions in addition to group.        Christian Miranda, LCAS

## 2013-11-30 ENCOUNTER — Other Ambulatory Visit (HOSPITAL_COMMUNITY): Payer: 59 | Admitting: Psychology

## 2013-11-30 ENCOUNTER — Encounter (HOSPITAL_COMMUNITY): Payer: Self-pay | Admitting: Psychology

## 2013-11-30 DIAGNOSIS — F1020 Alcohol dependence, uncomplicated: Secondary | ICD-10-CM | POA: Diagnosis not present

## 2013-11-30 DIAGNOSIS — F10230 Alcohol dependence with withdrawal, uncomplicated: Secondary | ICD-10-CM

## 2013-11-30 NOTE — Progress Notes (Signed)
    Daily Group Progress Note  Program: CD-IOP   Group Time: 1-2:30 pm  Participation Level: Active  Behavioral Response: Sharing  Type of Therapy: Process Group  Topic: Process: After checking in, group members spent the first part of group sharing what has been going on since our last group meeting on Wednesday. After processing, the group was asked to consider what they may have learned from the visit from the chaplain on Wednesday. The importance of self-compassion and self-care were discussed at length and each member shared what he/she would agree to focus on for their own self-care.   Group Time: 2:45- 4pm  Participation Level: Active  Behavioral Response: Appropriate  Type of Therapy: Psycho-education Group  Topic: Psychoeducation/Graduation: The second part of group was spent with group members "sculpting" their families.  Three members reenacted a sculpture of their biological family. They were asked to sculpt a scene from their childhood.  At the end of the group there was a graduation celebration for a group member who was leaving the program having completed successfully. Kind words of hope and self-care were shared by her fellow group members.   Summary: The patient reports he went back [facility]. As part of his personal self-care plan the patient reports that he is reading more. His uncle, who has been in ICU, is doing better, but he is still spending time at the hospital. He reported that his 22 yo cousin has a problem with alcohol and she has been calling him to pick her up and take her home. It was recommended to the patient that he should focus on his own recovery and not focus on trying to save her. He did not do his family sculpture but [facility]. His sobriety date remains 10/28.   Family Program: Family present? No   Name of family member(s):   UDS collected: No Results:  AA/NA attended?: YesWednesday  Sponsor?: Yes   Bobbye Petti, LCAS

## 2013-12-01 ENCOUNTER — Encounter (HOSPITAL_COMMUNITY): Payer: Self-pay | Admitting: Psychology

## 2013-12-01 NOTE — Progress Notes (Signed)
    Daily Group Progress Note  Program: CD-IOP   Group Time: 1-2:30 pm  Participation Level: Active  Behavioral Response: Sharing  Type of Therapy: Process Group  Topic: Group Process: the first part of group was spent in process. Members shared about the past weekend. One group member admitted he had relapsed on Friday night. His relapse was diagrammed and discussed at length. Members provided good suggestions and ideas about what he might have done differently. A handout was provided titled, "Recovery Activities" and members were invited to identify different things they do to support their recovery. The importance of routine and schedules was emphasized. By and large, members are not fully engaged in their recovery. They will need to re-up or be asked to leave.   Group Time: 2:45- 4pm  Participation Level: Active  Behavioral Response: Sharing  Type of Therapy: Psycho-education Group  Topic: Psycho-ED: the second half of group was spent in a psycho=-ed. Members continued the 'Family Sculpture" from the last session. Members sculpted their families and this event went much more smoothly since they were familiar with this therapeutic process from last Friday. There was also significantly increased and more productive feedback. Members made excellent comments and the sculpting members responded well to their group members' comments. Drug tests were collected from all 8 group members present.   Summary:The patient reported that he had gone back to work on Friday night and let work late. He had felt very frustrated and decided to buy a case of beer. He had driven out to a place in the country where he has drunk in the past. He considered drinking the beer, but then, after recounting the problems with alcohol, opted to break every bottle. He had gone bowling on Saturday night and when he mentioned that he might get a drink, they had assured him that they would stop him at any cost. The patient  reported he was considering getting a new sponsor. He explained that his current sponsor might not be the right one and he was considering a new sponsor. The patient admitted he had not attended any meetings. He stated that he had spoken with his employer and could go to work on 2nd shift and he might do better getting to meetings here in [place]. The patient made excuses and his tune was almost opposite what he has shared in previous sessions. The patient served in a number of the family sculptures and made some good comments. His sobriety date remains 10/24.   Family Program: Family present? No   Name of family member(s):   UDS collected: Yes Results: not back from lab yet  AA/NA attended?: No  Sponsor?: Yes   Azyah Flett, LCAS

## 2013-12-02 ENCOUNTER — Other Ambulatory Visit (HOSPITAL_COMMUNITY): Payer: 59 | Admitting: Psychology

## 2013-12-02 DIAGNOSIS — F10230 Alcohol dependence with withdrawal, uncomplicated: Secondary | ICD-10-CM

## 2013-12-02 DIAGNOSIS — F1020 Alcohol dependence, uncomplicated: Secondary | ICD-10-CM | POA: Diagnosis not present

## 2013-12-03 ENCOUNTER — Encounter (HOSPITAL_COMMUNITY): Payer: Self-pay | Admitting: Licensed Clinical Social Worker

## 2013-12-03 ENCOUNTER — Encounter (HOSPITAL_COMMUNITY): Payer: Self-pay | Admitting: Psychology

## 2013-12-03 NOTE — Progress Notes (Signed)
    Daily Group Progress Note  Program: CD-IOP   Group Time: 1-2:30 pm  Participation Level: Minimal  Behavioral Response: Appropriate  Type of Therapy: Psycho-Ed  Topic: Psycho-Ed: the first part of group was spent in a psycho-ed with the visiting Pharmacist. EP came from upstairs in the [facility] and spoke with the group about drugs, different types of mental illness and the medications prescribed to address them. Her presentation included the effects that drugs of addiction have on the brain and body and the medications used to address these imbalances. There were good questions asked by group members and the session proved very informative.   Group Time: 2:45- 4pm  Participation Level: Active  Behavioral Response: Sharing and Resistant  Type of Therapy: Process Group  Topic: Group Process: the second half of group was spent in process. Members shared about issues or concerns in early recovery and what they had done since we last met.  The issue and obvious resistance to attending 12-step meetings were discussed during this time. Members that are actively engaged in the Fellowship shared about their initial resistance, but also identified the many benefits that regular attendance provides them. Those finding meetings difficult or unpleasant were invited to identify the problems they are having. The importance of building support for this chronic illness was emphasized and most seemed to accept this tenet. Drug tests from Monday were returned and the results demonstrated that members had remained abstinent over the weekend.   Summary: The patient reported he drove to [facility] to find a new meeting and got lost that by the time he found it, the meeting was almost over. He is going to try again tomorrow and asked for more precise directions from group members. He worked 12 hours last night and has changed his work schedule to 2nd shift which will leave his whole day open to come to group and  go to [employer]. The patient admitted he had not anticipated how tired he would feel after just a day. He asked the group for clarification of whether he will always be an alcoholic. He received some good feedback from the group on clarifying that question and the message was, "Yes, he will always be an alcoholic". The patient reported his struggle with the [place] [facility] meetings policy and reported he was already disappointed with his sponsor and was looking for a new one. The patient received positive feedback from the group, but it remains to be seen as to whether he takes the suggestions of others with more knowledge and fully commits to his recovery. His sobriety date remains 10/24.   Family Program: Family present? No   Name of family member(s):   UDS collected: No Results:  AA/NA attended?: No  Sponsor?: No   Britney Captain, LCAS

## 2013-12-04 ENCOUNTER — Other Ambulatory Visit (HOSPITAL_COMMUNITY): Payer: 59 | Admitting: Psychology

## 2013-12-04 DIAGNOSIS — F10230 Alcohol dependence with withdrawal, uncomplicated: Secondary | ICD-10-CM

## 2013-12-04 DIAGNOSIS — F1020 Alcohol dependence, uncomplicated: Secondary | ICD-10-CM | POA: Diagnosis not present

## 2013-12-06 ENCOUNTER — Encounter (HOSPITAL_COMMUNITY): Payer: Self-pay | Admitting: Psychology

## 2013-12-06 NOTE — Progress Notes (Signed)
    Daily Group Progress Note  Program: CD-IOP   Group Time: 1-2:30 pm  Participation Level: Active  Behavioral Response: Sharing  Type of Therapy: Process Group  Topic: Group Process: The first part of group was spent in process. Members shared about issues and concerns they are facing in early recovery. During this session, two members completed their Family Sculpture. Group members were surprised at how much they learned as both members shared extensively about their home life and childhoods. The sculptures proved very revealing and group members agreed that they had gained insight into their fellow group member's lives and felt as if they knew they much better as a result. During group today the medical director met with at least 2 group members to discuss meds and lab results.   Group Time: 2:45-4pm  Participation Level: Active  Behavioral Response: Sharing  Type of Therapy: Psycho-education Group  Topic: Pro's & Con's: the second half of group was spent in a psycho-ed. Members were asked to identify the pros and cons of active addiction and sobriety. The session proved very challenging and at least member disclosed that he had realized a number of things about his drinking that he had never thought about before. Everyone shared freely and the topic seemed likely to continue to invite members to think more deeply about their relationships with chemicals.    Summary: The patient had attended the 12:10 AA meeting at the [employer] today and noted it was good to see 2 other group members there. He reported he had 'liked it' and plans to attend that meeting every day before this group session. The patient reported he was working a lot and felt as if he had begun to adjust to the shift change from 1st  to 2nd shift. The patient was asked to 'sculpt' his family and he proceeded to sculpt and then explain his family. The patient disclosed details of his life far beyond and depth shared  previously. The group had many questions bout this young man's family dynamics and were even more challenged when I asked about his S/O living with him. Group members were very interested to hear about this and he went on to describe a very unhealthy relationship among even more unhealthy ones. I reminded the patient that he didn't have to do any of these things and wondered why he didn't get his own place? He admitted he is staying for his 58 yo sister. The patient insisted that when she turns 16 and is able to drive herself to school, he will leave. In part two, the patient reported that drinking is part of his world and everyone does it. A con was identified as causing a lot of pain to others. He described himself as being 'crazy' when he drinks. A benefit of sobriety was that he became much more reliable and responsible while a con was that it required one to 'change my life'. the patient made some good comments and his sculpture proved very revealing. His sobriety date remains 10/24.    Family Program: Family present? No   Name of family member(s):   UDS collected: No Results:   AA/NA attended?: YesWednesday and Friday  Sponsor?: Yes   Griselda Tosh, LCAS

## 2013-12-07 ENCOUNTER — Other Ambulatory Visit (HOSPITAL_COMMUNITY): Payer: 59 | Admitting: Psychology

## 2013-12-07 DIAGNOSIS — F10230 Alcohol dependence with withdrawal, uncomplicated: Secondary | ICD-10-CM

## 2013-12-07 DIAGNOSIS — F1020 Alcohol dependence, uncomplicated: Secondary | ICD-10-CM | POA: Diagnosis not present

## 2013-12-08 ENCOUNTER — Encounter (HOSPITAL_COMMUNITY): Payer: Self-pay | Admitting: Psychology

## 2013-12-08 NOTE — Progress Notes (Unsigned)
   THERAPIST PROGRESS NOTE  Session Time: ***  Participation Level: {BHH PARTICIPATION LEVEL:22264}  Behavioral Response: {Appearance:22683}{BHH LEVEL OF CONSCIOUSNESS:22305}{BHH MOOD:22306}  Type of Therapy: {CHL AMB BH Type of Therapy:21022741}  Treatment Goals addressed: {CHL AMB BH Treatment Goals Addressed:21022754}  Interventions: {CHL AMB BH Type of Intervention:21022753}  Summary: Christian Miranda is a 23 y.o. male who presents with ***.   Suicidal/Homicidal: {BHH YES OR NO:22294}{yes/no/with/without intent/plan:22693}  Therapist Response: ***  Plan: Return again in *** weeks.  Diagnosis: Axis I: {psych axis 1:31909}    Axis II: {psych axis 2:31910}    MACKENZIE,LISBETH S, Licensed Cli 12/08/2013

## 2013-12-08 NOTE — Progress Notes (Signed)
Daily Group Progress Note  Program: CD-IOP   Group Time: 1-2:30 pm  Participation Level: Active  Behavioral Response: Sharing, rationalizing  Type of Therapy: Process Group  Topic: Process/Psycho-ed: the first part of group was spent in process. Members shared about current issues and challenges in early recovery. Included in these disclosures were 12-step meetings attended and any other sort of activity that promotes their sobriety. After check-in, a DVD on "The Neurobiology of Addiction" was shown. The focus of this film was the multitude of chemical reactions that occur totally unbeknownst to the addict that contribute to cravings, relapse and the difficulty of maintaining sobriety in recovery. The medical director met with a new group member and followed up with another during group today. Drug tests were collected from all group members today.   Group Time: 2:45- 4pm  Participation Level: Active  Behavioral Response: Sharing  Type of Therapy: Psycho-education Group  Topic: Intro/Psycho-Ed: The second half of group began with a new group member introducing himself. He had met with the medical director during the first part of group. Upon returning, he shared about his life and what he hopes to get from the group. He was welcomed by his new fellow group members. The remainder of the session was spent discussing a handout titled, "List of Coping Thoughts". The thoughts identified on this list are very beneficial for people struggling with major challenges in their lives. The underlying message to be taken from the handout was that things would not always be like they currently are, but that everything changes. The handout elicited a good discussion and feedback among group members.    Summary: The patient reported he had gone bowling over the weekend and found out he was pretty good. He claimed to have bowled 2 consecutive 300 games. Those are perfect scores. Group members viewed  this member incredulously. One member explained that this would be like playing 18 holes of golf and making 4 hole-in-ones. The patient stated a fellow walked over to him and asked if he wanted to join a bowling team? The patient reported the work hours he had recently changed over to weren't working and he was going back to the old ones. I questioned him on this disclosure and reminded him that on Friday he had told the group he was feeling fine and had adjusted to the new schedule. Today, the patient admitted he was too tired and asked to go back to an 8-5 schedule. He reported he had not attended a meeting today as he had previously informed the group he would. He had agreed to attend the three noon meetings at the [employer] on days he has group, but he missed the [place] meeting today. The patient said little about the DVD on brain chemistry and did not speak much during the discussion on the handout. He welcomed the new group member and reported he would be able to help him out a little when he needed a ride after group back to [place]. The patient remains somewhat resistant to getting in the 4 weekly 12-step meetings required, but has no problem staying out till 3 am on Saturday bowling with friends. His priorities will need to be realigned if he intends to remain in this program. The patient reported a sobriety date of 10/24.   Family Program: Family present? No   Name of family member(s):   UDS collected: Yes Results:not back from lab yet  AA/NA attended?: No  Sponsor?: No  Ashlyne Olenick, LCAS

## 2013-12-08 NOTE — Progress Notes (Signed)
    Daily Group Progress Note  Program: CD-IOP   Group Time: 1-2:30 pm  Participation Level: None  Behavioral Response: Appropriate  Type of Therapy: Psycho-education Group  Topic: Psycho-Ed: the first part of group was spent in a psycho-ed with the visiting Pharmacist. EP came from upstairs in the [facility] and spoke with the group about drugs, different types of mental illness and the medications prescribed to address them. Her presentation included the effects that drugs of addiction have on the brain and body and the medications used to address these imbalances. There were good questions asked by group members and the session proved very informative.   Group Time: 2:45- 4pm  Participation Level: Active  Behavioral Response: Sharing  Type of Therapy: Process Group  Topic: Group Process: the second half of group was spent in process. Members shared about issues or concerns in early recovery and what they had done since we last met.  The issue and obvious resistance to attending 12-step meetings were discussed during this time. Members that are actively engaged in the Fellowship shared about their initial resistance, but also identified the many benefits that regular attendance provides them. Those finding meetings difficult or unpleasant were invited to identify the problems they are having. The importance of building support for this chronic illness was emphasized and most seemed to accept this tenet. Drug tests from Monday were returned and the results demonstrated that members had remained abstinent over the weekend.   Summary: The patient was attentive, but did not engage in the discussion with the visiting pharmacist. He does not take any medications and may not have been very interested. The patient reported he drove to [facility] to find a new meeting and got lost so was unable to attend. He is going to try again tomorrow and asked for more precise directions from group members. He  worked 12 hours last night and has changed his work schedule to 2nd shift which will leave his whole day open to come to group and go to [employer]. He asked the group for clarification of whether he will always be an alcoholic. He received some good feedback from the group on clarifying that question. He reported his struggle on AA group attendance and finding a sponsor. He received positive feedback from the group. His sobriety date remains 10/24.    Family Program: Family present? No   Name of family member(s):   UDS collected: No Results:  AA/NA attended?: No  Sponsor?: Yes   Edona Schreffler, LCAS

## 2013-12-08 NOTE — Progress Notes (Unsigned)
Christian Miranda is a 23 y.o. male patient  Met with pt for his individual session to review progress on treatment goals. He is still unable to attend as many meetings as required by the program due to his work schedule and other responsibilities. He was encouraged to put his recovery first which includes 12-step meetings and finding a temporary sponsor. [place] meetings at times he is not at work. Discussed with the pt how he is trying to do too much while early in recovery, treatment and recovery need to come first. If this does not happen then he could lose everything due to his alcoholism. The pt still struggles with the definition of alcoholism and wants to believe at some point he may be able to control his drinking. It appears the pt puts himself in risky situations by being around others drinking, working too much with limited sleep and trying to please everyone else around him. He is not putting himself first nor his recovery. Pt was able to list pros and cons of staying sober and what he would like his future life to look like if he remains sober. Pt was also able to list all the things it will take for his to remain sober:  CD-IOP, sober recreation, sober peer group, exercise, schedule, 12-step meetings, people who hold him accountable, sponsor. Sobriety date remains the same. The intervention proved effective.        Aleisa Howk S, Licensed Cli

## 2013-12-09 ENCOUNTER — Other Ambulatory Visit (HOSPITAL_COMMUNITY): Payer: 59

## 2013-12-11 ENCOUNTER — Other Ambulatory Visit (HOSPITAL_COMMUNITY): Payer: 59

## 2013-12-14 ENCOUNTER — Other Ambulatory Visit (HOSPITAL_COMMUNITY): Payer: 59

## 2013-12-16 ENCOUNTER — Other Ambulatory Visit (HOSPITAL_COMMUNITY): Payer: 59 | Attending: Psychiatry

## 2013-12-18 ENCOUNTER — Other Ambulatory Visit (HOSPITAL_COMMUNITY): Payer: 59 | Admitting: Medical

## 2013-12-18 ENCOUNTER — Encounter (HOSPITAL_COMMUNITY): Payer: Self-pay | Admitting: Medical

## 2013-12-18 NOTE — Progress Notes (Signed)
  [facility] Summary   Christian Miranda [id number]  Date of Admission: 11/09/2013 Date of Discharge: 12/18/2013  Course of Treatment: [facility].He was seen in ED in July for same complaint but failed to followup.Prior to treatment he had begun to consume 8-18 drinks daily.His personality would change after drinking to the point he was aware no one who knew him wanted anything to do with him.He has lost one intimate relationship and was about to lose another. He was working and driving impaired.He displayed no evidence of physiologic withdrawal requiring detoxification.He was remarkably intellectually insightful into the alcoholic nature of his drinking (inability to control use and consequences of use) and very clear about his desire to quit in early stage alcoholism.Pt started out very active and compliant  With program but eventually c/o disenchantment with a sponsor and need to work and take care of others rather than attend CD-IOP and the required outside meetings.This week he called to say he would not be coming back for those same reasons.  Goals and Activities to Help Maintain Sobriety: 1. Stay away from old friends who continue to drink and use mind-altering chemicals. 2. Continue practicing Fair Fighting rules in interpersonal conflicts. 3. Continue alcohol and drug refusal skills and call on support systems. 4. Avoid known triggers  Referrals: NA   Aftercare services:  1. Attend AA meetings- 90 meetings in 90 days 2. Obtain a sponsor and a home group in AA 3. Seek counseling if needed  Next appointment: NA  Plan of Action to Address Continuing Problems: As outlined above    Client has NOT participated in the development of this discharge plan and has NOT received a copy of this completed plan  Maryjean MornKOBER, Christian Miranda  12/18/2013   BH-CIOP[facility] CHEM 12/18/2013

## 2013-12-21 ENCOUNTER — Other Ambulatory Visit (HOSPITAL_COMMUNITY): Payer: 59

## 2013-12-23 ENCOUNTER — Other Ambulatory Visit (HOSPITAL_COMMUNITY): Payer: 59

## 2013-12-25 ENCOUNTER — Other Ambulatory Visit (HOSPITAL_COMMUNITY): Payer: 59

## 2013-12-28 ENCOUNTER — Other Ambulatory Visit (HOSPITAL_COMMUNITY): Payer: 59

## 2013-12-29 ENCOUNTER — Encounter: Payer: Self-pay | Admitting: Psychiatry

## 2013-12-30 ENCOUNTER — Other Ambulatory Visit (HOSPITAL_COMMUNITY): Payer: 59

## 2014-01-01 ENCOUNTER — Other Ambulatory Visit (HOSPITAL_COMMUNITY): Payer: 59

## 2014-01-04 ENCOUNTER — Other Ambulatory Visit (HOSPITAL_COMMUNITY): Payer: 59

## 2014-01-06 ENCOUNTER — Other Ambulatory Visit (HOSPITAL_COMMUNITY): Payer: 59

## 2014-01-11 ENCOUNTER — Other Ambulatory Visit (HOSPITAL_COMMUNITY): Payer: 59

## 2014-01-13 ENCOUNTER — Other Ambulatory Visit (HOSPITAL_COMMUNITY): Payer: 59

## 2014-01-18 ENCOUNTER — Other Ambulatory Visit (HOSPITAL_COMMUNITY): Payer: 59

## 2014-01-20 ENCOUNTER — Other Ambulatory Visit (HOSPITAL_COMMUNITY): Payer: 59

## 2014-01-22 ENCOUNTER — Other Ambulatory Visit (HOSPITAL_COMMUNITY): Payer: 59

## 2014-01-25 ENCOUNTER — Other Ambulatory Visit (HOSPITAL_COMMUNITY): Payer: 59

## 2014-01-27 ENCOUNTER — Other Ambulatory Visit (HOSPITAL_COMMUNITY): Payer: 59

## 2014-01-29 ENCOUNTER — Other Ambulatory Visit (HOSPITAL_COMMUNITY): Payer: 59

## 2018-04-08 DIAGNOSIS — S139XXA Sprain of joints and ligaments of unspecified parts of neck, initial encounter: Secondary | ICD-10-CM | POA: Diagnosis not present

## 2018-04-08 DIAGNOSIS — M4120 Other idiopathic scoliosis, site unspecified: Secondary | ICD-10-CM | POA: Diagnosis not present

## 2018-07-16 ENCOUNTER — Other Ambulatory Visit: Payer: Self-pay

## 2018-07-16 DIAGNOSIS — Z20822 Contact with and (suspected) exposure to covid-19: Secondary | ICD-10-CM

## 2018-07-22 LAB — NOVEL CORONAVIRUS, NAA: SARS-CoV-2, NAA: NOT DETECTED

## 2018-07-30 DIAGNOSIS — F17219 Nicotine dependence, cigarettes, with unspecified nicotine-induced disorders: Secondary | ICD-10-CM | POA: Diagnosis not present

## 2018-07-30 DIAGNOSIS — Z23 Encounter for immunization: Secondary | ICD-10-CM | POA: Diagnosis not present

## 2018-07-30 DIAGNOSIS — R5383 Other fatigue: Secondary | ICD-10-CM | POA: Diagnosis not present

## 2018-07-30 DIAGNOSIS — G4489 Other headache syndrome: Secondary | ICD-10-CM | POA: Diagnosis not present

## 2018-07-30 DIAGNOSIS — Z716 Tobacco abuse counseling: Secondary | ICD-10-CM | POA: Diagnosis not present

## 2018-07-30 DIAGNOSIS — Z0189 Encounter for other specified special examinations: Secondary | ICD-10-CM | POA: Diagnosis not present

## 2018-08-06 DIAGNOSIS — G4489 Other headache syndrome: Secondary | ICD-10-CM | POA: Diagnosis not present

## 2018-08-12 ENCOUNTER — Encounter: Payer: Self-pay | Admitting: Neurology

## 2018-08-12 ENCOUNTER — Ambulatory Visit: Payer: BC Managed Care – PPO | Admitting: Neurology

## 2018-08-12 ENCOUNTER — Other Ambulatory Visit: Payer: Self-pay

## 2018-08-12 DIAGNOSIS — G43009 Migraine without aura, not intractable, without status migrainosus: Secondary | ICD-10-CM | POA: Diagnosis not present

## 2018-08-12 DIAGNOSIS — G8929 Other chronic pain: Secondary | ICD-10-CM | POA: Insufficient documentation

## 2018-08-12 DIAGNOSIS — M542 Cervicalgia: Secondary | ICD-10-CM | POA: Diagnosis not present

## 2018-08-12 DIAGNOSIS — R51 Headache: Secondary | ICD-10-CM

## 2018-08-12 DIAGNOSIS — R519 Headache, unspecified: Secondary | ICD-10-CM

## 2018-08-12 DIAGNOSIS — G43909 Migraine, unspecified, not intractable, without status migrainosus: Secondary | ICD-10-CM | POA: Insufficient documentation

## 2018-08-12 MED ORDER — NORTRIPTYLINE HCL 25 MG PO CAPS: 25.0000 mg | ORAL_CAPSULE | Freq: Every day | ORAL | 3 refills | Status: DC

## 2018-08-12 NOTE — Progress Notes (Signed)
[facility]  PATIENT: Christian Miranda DOB: 06/05/1990  REFERRING DOCTOR OR PCP: Moshe CiproStephanie Matthews, FNP/John Margo AyeHall, MD SOURCE: Patient, Notes from Dr. Margo AyeHall  _________________________________   HISTORICAL  CHIEF COMPLAINT:  Chief Complaint  Patient presents with  . New Patient (Initial Visit)    RM 12, alone. Paper referral from Moshe CiproStephanie Matthews, NP for migraines. Started in the last couple weeks. This is new. Denies any injuries. Grandpa and great grandpa has cluster headaches    HISTORY OF PRESENT ILLNESS:  I [facility] the [facility] Christian Miranda[facility] regarding his headaches.  He is a 28 year old man who has had headaches for the past several weeks.    Pain is bilateral when intense bit he notes it more in the left temple and periorbital region.    He denies neck pain.   Pain is throbbing and intense.    He denies nausea or vomiting.  He has photophobia and phonophobia.   He does not note much difference with movements.   Wearing his hat improves the pain.    Nothing seems to make the pain worse predictably.       He is having 3 days a week of severe headache pain but 6 days /week of some headache.   Afternoon headaches are worse but he wakes up with milder headaches daily.      He has had 2 episodes where he felt lightheadedness and presyncope when pain was more intense.   He sat down and symptoms passed.      He has not been on any prophylactic medication for the headaches.  Ibuprofen helps slightly but inadequately most of the time..  Rizatriptan did not help.  Bernita RaisinUbrelvy has helped and pain improved rapidly.   He notes  His paternal grandfather and great grandfather had cluster headaches and his mom has migraines.      REVIEW OF SYSTEMS: Constitutional: No fevers, chills, sweats, or change in appetite Eyes: No visual changes, double vision, eye pain Ear, nose and throat: No hearing  loss, ear pain, nasal congestion, sore throat Cardiovascular: No chest pain, palpitations Respiratory: No shortness of breath at rest or with exertion.   No wheezes GastrointestinaI: No nausea, vomiting, diarrhea, abdominal pain, fecal incontinence Genitourinary: No dysuria, urinary retention or frequency.  No nocturia. Musculoskeletal: No neck pain, back pain Integumentary: No rash, pruritus, skin lesions Neurological: as above Psychiatric: No depression at this time.  No anxiety Endocrine: No palpitations, diaphoresis, change in appetite, change in weigh or increased thirst Hematologic/Lymphatic: No anemia, purpura, petechiae. Allergic/Immunologic: No itchy/runny eyes, nasal congestion, recent allergic reactions, rashes  ALLERGIES: Allergies  Allergen Reactions  . Sulfa Antibiotics Hives    HOME MEDICATIONS:  Current Outpatient Medications:  .  promethazine (PHENERGAN) 25 MG tablet, TAKE 1 TABLET BY MOUTH AS NEEDED FOR MIGRAINE HEADACHE, Disp: , Rfl:  .  rizatriptan (MAXALT) 5 MG tablet, TAKE 1 TABLET BY MOUTH WITH MIGRAINE AS NEEDED, Disp: , Rfl:  .  Ubrogepant (UBRELVY) 100 MG TABS, Take 1 Dose by mouth as needed. , Disp: , Rfl:  .  nortriptyline (PAMELOR) 25 MG capsule, Take 1 capsule (25 mg total) by mouth at bedtime., Disp: 30 capsule, Rfl: 3  PAST MEDICAL HISTORY: Past Medical History:  Diagnosis Date  . Adolescent behavior problems 08/01/2012  . Asthma     PAST SURGICAL HISTORY: Past Surgical History:  Procedure Laterality Date  . TONSILLECTOMY  FAMILY HISTORY: Family History  Problem Relation Age of Onset  . Alcohol abuse Paternal Aunt   . Alcohol abuse Paternal Uncle   . Alcohol abuse Cousin     SOCIAL HISTORY:  Social History   Socioeconomic History  . Marital status: Single    Spouse name: Not on file  . Number of children: Not on file  . Years of education: Not on file  . Highest education level: Not on file  Occupational History  . Not  on file  Social Needs  . Financial resource strain: Not on file  . Food insecurity    Worry: Not on file    Inability: Not on file  . Transportation needs    Medical: Not on file    Non-medical: Not on file  Tobacco Use  . Smoking status: Current Every Day Smoker    Packs/day: 1.50    Types: Cigarettes  . Tobacco comment: smokes one cigarette/day. trying to quit  Substance and Sexual Activity  . Alcohol use: Yes    Alcohol/week: 35.0 standard drinks    Types: 35 Cans of beer per week    Comment: Patient reports he consumes 18 pack of beer daily.   . Drug use: No    Comment: Patient denies  . Sexual activity: Not on file  Lifestyle  . Physical activity    Days per week: Not on file    Minutes per session: Not on file  . Stress: Not on file  Relationships  . Social Herbalist on phone: Not on file    Gets together: Not on file    Attends religious service: Not on file    Active member of club or organization: Not on file    Attends meetings of clubs or organizations: Not on file    Relationship status: Not on file  . Intimate partner violence    Fear of current or ex partner: Not on file    Emotionally abused: Not on file    Physically abused: Not on file    Forced sexual activity: Not on file  Other Topics Concern  . Not on file  Social History Narrative   Lives   Caffeine use:      PHYSICAL EXAM  Vitals:   08/12/18 1255  BP: 110/60  Pulse: 82  Temp: 98.4 F (36.9 C)  SpO2: 98%  Weight: 148 lb 6.4 oz (67.3 kg)  Height: 5\' 10"  (1.778 m)    Body mass index is 21.29 kg/m.   General: The patient is well-developed and well-nourished and in no acute distress  HEENT:  Head is [place]/AT.  Sclera are anicteric.  Funduscopic exam shows normal optic discs and retinal vessels.  Neck: No carotid bruits are noted.  He has mild to moderate tenderness over the left occiput.  Neck has normal range of motion.  Cardiovascular: The heart has a regular rate and  rhythm with a normal S1 and S2. There were no murmurs, gallops or rubs.    Skin: Extremities are without rash or  edema.  Musculoskeletal:  Back is nontender  Neurologic Exam  Mental status: The patient is alert and oriented x 3 at the time of the examination. The patient has apparent normal recent and remote memory, with an apparently normal attention span and concentration ability.   Speech is normal.  Cranial nerves: Extraocular movements are full. Pupils are equal, round, and reactive to light and accomodation.  Visual fields are full.  Facial symmetry  is present. There is good facial sensation to soft touch bilaterally.Facial strength is normal.  Trapezius and sternocleidomastoid strength is normal. No dysarthria is noted.  The tongue is midline, and the patient has symmetric elevation of the soft palate. No obvious hearing deficits are noted.  Motor:  Muscle bulk is normal.   Tone is normal. Strength is  5 / 5 in all 4 extremities.   Sensory: Sensory testing is intact to pinprick, soft touch and vibration sensation in all 4 extremities.  Coordination: Cerebellar testing reveals good finger-nose-finger and heel-to-shin bilaterally.  Gait and station: Station is normal.   Gait is normal. Tandem gait is normal. Romberg is negative.   Reflexes: Deep tendon reflexes are symmetric and normal bilaterally.   Plantar responses are flexor.     DIAGNOSTIC DATA (LABS, IMAGING, TESTING) - I reviewed patient records, labs, notes, testing and imaging myself where available.  Lab Results  Component Value Date   WBC 7.3 11/06/2013   HGB 16.7 11/06/2013   HCT 48.9 11/06/2013   MCV 86.5 11/06/2013   PLT 249 11/06/2013      Component Value Date/Time   NA 141 11/06/2013 1228   K 4.3 11/06/2013 1228   CL 104 11/06/2013 1228   CO2 25 11/06/2013 1228   GLUCOSE 89 11/06/2013 1228   BUN 7 11/06/2013 1228   CREATININE 0.71 11/06/2013 1228   CALCIUM 9.5 11/06/2013 1228   PROT 7.7 11/06/2013  1228   ALBUMIN 4.4 11/06/2013 1228   AST 14 11/06/2013 1228   ALT 12 11/06/2013 1228   ALKPHOS 39 11/06/2013 1228   BILITOT 0.4 11/06/2013 1228   GFRNONAA >90 11/06/2013 1228   GFRAA >90 11/06/2013 1228       ASSESSMENT AND PLAN  1. Chronic nonintractable headache, unspecified headache type   2. Neck pain   3. Migraine without aura and without status migrainosus, not intractable     In summary, Christian Miranda is a 28 year old man with a several week history of headaches with mixed tension and migrainous characteristics.  His neurologic examination is normal.  As the headaches are occurring frequently I will add nortriptyline 25 mg nightly as a prophylactic agent.  He had a benefit with Ubrelvy, and anti-CGRP pill, and will take again if he has more severe headaches.  We did discuss that if he does not get a benefit from nortriptyline I will try another oral agent but if he failed a couple of oral agents and he continues to have near daily headaches, we could consider 1 of the anti-CGRP injections.  He will return to see me in 2 to 3 months or sooner if there are new or worsening neurologic symptoms.  Thank you for asking me to see Christian Miranda.  Please let me know if I can be of further assistance with him or other patients in the future    Mikylah Ackroyd A. Epimenio FootSater, MD, [facility],FAAN 08/12/2018, 5:26 PM Certified in Neurology, Clinical Neurophysiology, Sleep Medicine and Neuroimaging  [facility] [address], [address] [place], [place] [address] [phone]

## 2018-09-08 DIAGNOSIS — R509 Fever, unspecified: Secondary | ICD-10-CM | POA: Diagnosis not present

## 2018-09-08 DIAGNOSIS — R112 Nausea with vomiting, unspecified: Secondary | ICD-10-CM | POA: Diagnosis not present

## 2018-09-08 DIAGNOSIS — R197 Diarrhea, unspecified: Secondary | ICD-10-CM | POA: Diagnosis not present

## 2018-11-06 ENCOUNTER — Telehealth: Payer: Self-pay | Admitting: Neurology

## 2018-11-06 NOTE — Telephone Encounter (Signed)
I called patient regarding confirming 10/28 appt. Patient states he will be working and will need to call back to reschedule.

## 2018-11-12 ENCOUNTER — Ambulatory Visit: Payer: BC Managed Care – PPO | Admitting: Neurology

## 2018-12-25 ENCOUNTER — Other Ambulatory Visit: Payer: Self-pay

## 2018-12-25 DIAGNOSIS — Z20822 Contact with and (suspected) exposure to covid-19: Secondary | ICD-10-CM

## 2018-12-26 LAB — NOVEL CORONAVIRUS, NAA: SARS-CoV-2, NAA: NOT DETECTED

## 2019-03-21 ENCOUNTER — Inpatient Hospital Stay (HOSPITAL_COMMUNITY)
Admission: AD | Admit: 2019-03-21 | Discharge: 2019-03-24 | DRG: 881 | Disposition: A | Discharge status: Home / Self Care | Payer: 59 | Attending: Psychiatry | Admitting: Psychiatry

## 2019-03-21 ENCOUNTER — Encounter (HOSPITAL_COMMUNITY): Payer: Self-pay | Admitting: Family

## 2019-03-21 ENCOUNTER — Emergency Department (EMERGENCY_DEPARTMENT_HOSPITAL)
Admission: EM | Admit: 2019-03-21 | Discharge: 2019-03-21 | Disposition: A | Discharge status: Other Acute Inpatient Hospital | Payer: 59 | Source: Home / Self Care | Attending: Emergency Medicine | Admitting: Emergency Medicine

## 2019-03-21 ENCOUNTER — Other Ambulatory Visit: Payer: Self-pay

## 2019-03-21 ENCOUNTER — Encounter (HOSPITAL_COMMUNITY): Payer: Self-pay

## 2019-03-21 DIAGNOSIS — Z20822 Contact with and (suspected) exposure to covid-19: Secondary | ICD-10-CM | POA: Diagnosis present

## 2019-03-21 DIAGNOSIS — R45851 Suicidal ideations: Secondary | ICD-10-CM | POA: Diagnosis present

## 2019-03-21 DIAGNOSIS — F1010 Alcohol abuse, uncomplicated: Secondary | ICD-10-CM | POA: Diagnosis present

## 2019-03-21 DIAGNOSIS — F329 Major depressive disorder, single episode, unspecified: Secondary | ICD-10-CM | POA: Diagnosis present

## 2019-03-21 DIAGNOSIS — J45909 Unspecified asthma, uncomplicated: Secondary | ICD-10-CM | POA: Diagnosis present

## 2019-03-21 DIAGNOSIS — F1020 Alcohol dependence, uncomplicated: Secondary | ICD-10-CM | POA: Insufficient documentation

## 2019-03-21 DIAGNOSIS — F322 Major depressive disorder, single episode, severe without psychotic features: Secondary | ICD-10-CM | POA: Insufficient documentation

## 2019-03-21 DIAGNOSIS — F10129 Alcohol abuse with intoxication, unspecified: Secondary | ICD-10-CM | POA: Diagnosis present

## 2019-03-21 DIAGNOSIS — Z634 Disappearance and death of family member: Secondary | ICD-10-CM

## 2019-03-21 DIAGNOSIS — Y906 Blood alcohol level of 120-199 mg/100 ml: Secondary | ICD-10-CM | POA: Diagnosis present

## 2019-03-21 DIAGNOSIS — F17210 Nicotine dependence, cigarettes, uncomplicated: Secondary | ICD-10-CM | POA: Diagnosis present

## 2019-03-21 DIAGNOSIS — G4700 Insomnia, unspecified: Secondary | ICD-10-CM | POA: Diagnosis present

## 2019-03-21 DIAGNOSIS — F1024 Alcohol dependence with alcohol-induced mood disorder: Secondary | ICD-10-CM | POA: Diagnosis not present

## 2019-03-21 DIAGNOSIS — Z79899 Other long term (current) drug therapy: Secondary | ICD-10-CM | POA: Insufficient documentation

## 2019-03-21 LAB — COMPREHENSIVE METABOLIC PANEL
ALT: 23 U/L (ref 0–44)
AST: 25 U/L (ref 15–41)
Albumin: 5 g/dL (ref 3.5–5.0)
Alkaline Phosphatase: 27 U/L — ABNORMAL LOW (ref 38–126)
Anion gap: 12 (ref 5–15)
BUN: 5 mg/dL — ABNORMAL LOW (ref 6–20)
CO2: 23 mmol/L (ref 22–32)
Calcium: 8.9 mg/dL (ref 8.9–10.3)
Chloride: 109 mmol/L (ref 98–111)
Creatinine, Ser: 0.67 mg/dL (ref 0.61–1.24)
GFR calc Af Amer: >60 mL/min (ref >60)
GFR calc non Af Amer: >60 mL/min (ref >60)
Glucose, Bld: 103 mg/dL — ABNORMAL HIGH (ref 70–99)
Potassium: 3.9 mmol/L (ref 3.5–5.1)
Sodium: 144 mmol/L (ref 135–145)
Total Bilirubin: 0.5 mg/dL (ref 0.3–1.2)
Total Protein: 8 g/dL (ref 6.5–8.1)

## 2019-03-21 LAB — CBC WITH DIFFERENTIAL/PLATELET
Abs Immature Granulocytes: 0.06 10*3/uL (ref 0.00–0.07)
Basophils Absolute: 0 10*3/uL (ref 0.0–0.1)
Basophils Relative: 0 %
Eosinophils Absolute: 0.1 10*3/uL (ref 0.0–0.5)
Eosinophils Relative: 1 %
HCT: 48.9 % (ref 39.0–52.0)
Hemoglobin: 15.8 g/dL (ref 13.0–17.0)
Immature Granulocytes: 1 %
Lymphocytes Relative: 23 %
Lymphs Abs: 2.6 10*3/uL (ref 0.7–4.0)
MCH: 28.5 pg (ref 26.0–34.0)
MCHC: 32.3 g/dL (ref 30.0–36.0)
MCV: 88.1 fL (ref 80.0–100.0)
Monocytes Absolute: 0.7 10*3/uL (ref 0.1–1.0)
Monocytes Relative: 6 %
Neutro Abs: 7.7 10*3/uL (ref 1.7–7.7)
Neutrophils Relative %: 69 %
Platelets: 352 10*3/uL (ref 150–400)
RBC: 5.55 MIL/uL (ref 4.22–5.81)
RDW: 13.7 % (ref 11.5–15.5)
WBC: 11.2 10*3/uL — ABNORMAL HIGH (ref 4.0–10.5)
nRBC: 0 % (ref 0.0–0.2)

## 2019-03-21 LAB — ACETAMINOPHEN LEVEL: Acetaminophen (Tylenol), Serum: <10 ug/mL — ABNORMAL LOW (ref 10–30)

## 2019-03-21 LAB — RESPIRATORY PANEL BY RT PCR (FLU A&B, COVID)
Influenza A by PCR: NEGATIVE
Influenza B by PCR: NEGATIVE
SARS Coronavirus 2 by RT PCR: NEGATIVE

## 2019-03-21 LAB — SALICYLATE LEVEL: Salicylate Lvl: <7 mg/dL — ABNORMAL LOW (ref 7.0–30.0)

## 2019-03-21 LAB — ETHANOL: Alcohol, Ethyl (B): 197 mg/dL — ABNORMAL HIGH (ref <10)

## 2019-03-21 MED ORDER — MAGNESIUM HYDROXIDE 400 MG/5ML PO SUSP: 30.0000 mL | Freq: Every day | ORAL | Status: DC | PRN

## 2019-03-21 MED ORDER — LORAZEPAM 1 MG PO TABS: 0.0000 mg | ORAL_TABLET | Freq: Two times a day (BID) | ORAL | Status: DC

## 2019-03-21 MED ORDER — LORAZEPAM 1 MG PO TABS: 0.0000 mg | ORAL_TABLET | Freq: Four times a day (QID) | ORAL | Status: DC

## 2019-03-21 MED ORDER — THIAMINE HCL 100 MG PO TABS: 100.0000 mg | ORAL_TABLET | Freq: Every day | ORAL | Status: DC

## 2019-03-21 MED ORDER — ACETAMINOPHEN 325 MG PO TABS
650.0000 mg | ORAL_TABLET | Freq: Four times a day (QID) | ORAL | Status: DC | PRN
  Administered 2019-03-23 (×2): 650 mg via ORAL
  Filled 2019-03-21 (×2): qty 2

## 2019-03-21 MED ORDER — TRAZODONE HCL 50 MG PO TABS
50.0000 mg | ORAL_TABLET | Freq: Every evening | ORAL | Status: DC | PRN
  Administered 2019-03-22 – 2019-03-23 (×2): 50 mg via ORAL
  Filled 2019-03-21 (×3): qty 1

## 2019-03-21 MED ORDER — ENSURE ENLIVE PO LIQD
237.0000 mL | Freq: Two times a day (BID) | ORAL | Status: DC
  Administered 2019-03-22 – 2019-03-23 (×3): 237 mL via ORAL

## 2019-03-21 MED ORDER — ALUM & MAG HYDROXIDE-SIMETH 200-200-20 MG/5ML PO SUSP: 30.0000 mL | ORAL | Status: DC | PRN

## 2019-03-21 MED ORDER — NICOTINE 21 MG/24HR TD PT24
21.0000 mg | MEDICATED_PATCH | Freq: Once | TRANSDERMAL | Status: DC
  Administered 2019-03-21: 21 mg via TRANSDERMAL
  Filled 2019-03-21: qty 1

## 2019-03-21 MED ORDER — HYDROXYZINE HCL 25 MG PO TABS: 25.0000 mg | ORAL_TABLET | Freq: Three times a day (TID) | ORAL | Status: DC | PRN
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
## 2019-03-21 NOTE — Progress Notes (Signed)
Pending negative COVID test and medical clearance - patient has been accepted to the service of [facility] at [facility] Room 303-01
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
## 2019-03-21 NOTE — Tx Team (Signed)
Initial Treatment Plan 03/21/2019 8:03 PM [name] [name]:[id number]    PATIENT STRESSORS: Loss of relationship (gf) Substance abuse   PATIENT STRENGTHS: Average or above average intelligence Communication skills Financial means Physical Health Supportive family/friends   PATIENT IDENTIFIED PROBLEMS:      " I have some things to work on"     " relapsed on alcohol"     "anxiety"         DISCHARGE CRITERIA:  Improved stabilization in mood, thinking, and/or behavior Reduction of life-threatening or endangering symptoms to within safe limits Verbal commitment to aftercare and medication compliance  PRELIMINARY DISCHARGE PLAN: Attend 12-step recovery group Outpatient therapy Return to previous living arrangement  PATIENT/FAMILY INVOLVEMENT: This treatment plan has been presented to and reviewed with the patient, Christian Miranda,  The patient has been given the opportunity to ask questions and make suggestions.  Shela Nevin, RN 03/21/2019, 8:03 PM

## 2019-03-21 NOTE — Progress Notes (Signed)
   03/21/19 2248  COVID-19 Daily Checkoff  Have you had a fever (temp > 37.80C/100F)  in the past 24 hours?  No  If you have had runny nose, nasal congestion, sneezing in the past 24 hours, has it worsened? No  COVID-19 EXPOSURE  Have you traveled outside the state in the past 14 days? No  Have you been in contact with someone with a confirmed diagnosis of COVID-19 or PUI in the past 14 days without wearing appropriate PPE? No  Have you been living in the same home as a person with confirmed diagnosis of COVID-19 or a PUI (household contact)? No  Have you been diagnosed with COVID-19? No
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
## 2019-03-21 NOTE — Progress Notes (Signed)
pt getting blood drawn when TTS called in to complete assessment.

## 2019-03-21 NOTE — ED Notes (Addendum)
Patient has sobered up since this morning and has become more agitated with his current situation being held in the ED.  Patient states he is getting angry and has been calm and cooperative, but wants to know what he is still being held in the ED.  I explained the process of being medically cleared. He states he would cooperate.  Patient was offered medication to help him calm down and rest. Patient refused.

## 2019-03-21 NOTE — Progress Notes (Signed)
Per Nira Conn, NP pt is recommended for continued observation for safety and stabilization and to be reassessed by psych. EDP Devoria Albe, MD and Theresia Lo, RN have been advised.
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
## 2019-03-21 NOTE — ED Notes (Signed)
Vitals entered at this time. Vitals WNL. Patient in no distress

## 2019-03-21 NOTE — Progress Notes (Signed)
   03/21/19 2250  Psych Admission Type (Psych Patients Only)  Admission Status Involuntary  Psychosocial Assessment  Patient Complaints None  Eye Contact Brief  Facial Expression Flat  Affect Appropriate to circumstance  Speech Slow  Interaction Minimal  Motor Activity Other (Comment) (WDL)  Appearance/Hygiene Disheveled  Behavior Characteristics Appropriate to situation  Mood Depressed  Thought Process  Coherency WDL  Content WDL  Delusions None reported or observed  Perception WDL  Hallucination None reported or observed  Judgment Poor  Confusion None  Danger to Self  Current suicidal ideation? Denies  Danger to Others  Danger to Others None reported or observed

## 2019-03-21 NOTE — Progress Notes (Signed)
Per Theresia Lo, RN pt is being served [employer] but they are still pending and waiting to be sent to the magistrate.

## 2019-03-21 NOTE — Progress Notes (Signed)
Report may be called to [phone] when transportation has been arranged

## 2019-03-22 DIAGNOSIS — F1024 Alcohol dependence with alcohol-induced mood disorder: Secondary | ICD-10-CM

## 2019-03-22 LAB — TSH: TSH: 0.493 u[IU]/mL (ref 0.350–4.500)

## 2019-03-22 MED ORDER — NICOTINE 21 MG/24HR TD PT24
MEDICATED_PATCH | TRANSDERMAL | Status: AC
  Filled 2019-03-22: qty 1

## 2019-03-22 MED ORDER — THIAMINE HCL 100 MG PO TABS
100.0000 mg | ORAL_TABLET | Freq: Every day | ORAL | Status: DC
  Administered 2019-03-23 – 2019-03-24 (×2): 100 mg via ORAL
  Filled 2019-03-22 (×4): qty 1

## 2019-03-22 MED ORDER — THIAMINE HCL 100 MG/ML IJ SOLN: 100.0000 mg | Freq: Once | INTRAMUSCULAR | Status: DC

## 2019-03-22 MED ORDER — ADULT MULTIVITAMIN W/MINERALS CH
1.0000 | ORAL_TABLET | Freq: Every day | ORAL | Status: DC
  Administered 2019-03-22 – 2019-03-24 (×3): 1 via ORAL
  Filled 2019-03-22 (×6): qty 1

## 2019-03-22 MED ORDER — ONDANSETRON 4 MG PO TBDP: 4.0000 mg | ORAL_TABLET | Freq: Four times a day (QID) | ORAL | Status: DC | PRN

## 2019-03-22 MED ORDER — HYDROXYZINE HCL 25 MG PO TABS
25.0000 mg | ORAL_TABLET | Freq: Four times a day (QID) | ORAL | Status: DC | PRN
  Filled 2019-03-22: qty 1

## 2019-03-22 MED ORDER — LOPERAMIDE HCL 2 MG PO CAPS: 2.0000 mg | ORAL_CAPSULE | ORAL | Status: DC | PRN

## 2019-03-22 MED ORDER — NICOTINE 21 MG/24HR TD PT24
21.0000 mg | MEDICATED_PATCH | Freq: Every day | TRANSDERMAL | Status: DC
  Administered 2019-03-22 – 2019-03-24 (×3): 21 mg via TRANSDERMAL
  Filled 2019-03-22 (×5): qty 1

## 2019-03-22 MED ORDER — LORAZEPAM 1 MG PO TABS: 1.0000 mg | ORAL_TABLET | Freq: Four times a day (QID) | ORAL | Status: DC | PRN

## 2019-03-22 NOTE — Progress Notes (Addendum)
   03/22/19 0900  Psych Admission Type (Psych Patients Only)  Admission Status Involuntary  Psychosocial Assessment  Patient Complaints None  Eye Contact Brief  Facial Expression Flat  Affect Appropriate to circumstance  Speech Slow  Interaction Minimal  Motor Activity Other (Comment) (WDL)  Appearance/Hygiene Disheveled  Behavior Characteristics Cooperative;Appropriate to situation  Mood Anxious  Thought Process  Coherency WDL  Content WDL  Delusions None reported or observed  Perception WDL  Hallucination None reported or observed  Judgment Poor  Confusion None  Danger to Self  Current suicidal ideation? Denies  Danger to Others  Danger to Others None reported or observed    Pt has been calm and cooperative- friendly during interactions- has been visible in the milieu interacting appropriately with peers. Per pt's self inventory, pt rated his depression, hopelessness and anxiety a 5/7/5, respectively. Pt writes that his goal today is "try to figure things out", and writes, I will "give it my best" to help him to meet his goal. Pt currently denies SI/HI and A/V H
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
## 2019-03-22 NOTE — [facility] Group Notes (Signed)
Adult Psychoeducational Group Note  Date:  03/22/2019 Time:  9:53 PM  Group Topic/Focus:  Wrap-Up Group:   The focus of this group is to help patients review their daily goal of treatment and discuss progress on daily workbooks.  Participation Level:  Active  Participation Quality:  Appropriate and Attentive  Affect:  Appropriate  Cognitive:  Alert and Appropriate  Insight: Appropriate and Good  Engagement in Group:  Engaged  Modes of Intervention:  Discussion and Education  Additional Comments:  Pt attended and participated in wrap up group this evening and rated their day an 8/10. Pt is feeling better since being here and is working on a discharge plan. Pt goal was to "figure things out", such as how to start feeling better.   Chrisandra Netters 03/22/2019, 9:53 PM

## 2019-03-22 NOTE — [facility] Group Notes (Signed)
[facility] LCSW Group Therapy Note  03/22/2019    Type of Therapy and Topic:  Group Therapy:  Adding Supports Including Yourself  Participation Level:  Active   Description of Group:   Patients in this group were introduced to the concept that additional supports including self-support are an essential part of recovery.  Patients listed what supports they believe they need to add to their lives to achieve their goals at discharge, and they listed such things as therapist, family, doctor, support groups, 12-step groups and service animals.   A song entitled "My Own Hero" was played and a group discussion ensued in which patients stated they could relate to the song and it inspired them to realize they have be willing to help themselves in order to succeed, because other people cannot achieve sobriety or stability for them.  "Fight For It" was played, then "I Am Enough" to encourage patients.  They discussed the impact on them and how they must remain convinced that their lives are worth the effort it takes to become sober and/or stable.  Therapeutic Goals: 1)  demonstrate the importance of being a key part of one's own support system 2)  discuss various available supports 3)  encourage patient to use music as part of their self-support and focus on goals 4)  elicit ideas from patients about supports that need to be added   Summary of Patient Progress:  The patient expressed himself freely during group and indicated an understanding at the need to be part of one's own support system, yet how hard that can be.     Therapeutic Modalities:   Motivational Interviewing Activity  Meka Lewan J Grossman-Orr  8:30 AM
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
## 2019-03-22 NOTE — Progress Notes (Signed)
[facility] NOVEL CORONAVIRUS (COVID-19) DAILY CHECK-OFF SYMPTOMS - answer yes or no to each - every day NO YES  Have you had a fever in the past 24 hours?  . Fever (Temp > 37.80C / 100F) X   Have you had any of these symptoms in the past 24 hours? . New Cough .  Sore Throat  .  Shortness of Breath .  Difficulty Breathing .  Unexplained Body Aches   X   Have you had any one of these symptoms in the past 24 hours not related to allergies?   . Runny Nose .  Nasal Congestion .  Sneezing   X   If you have had runny nose, nasal congestion, sneezing in the past 24 hours, has it worsened?  X   EXPOSURES - check yes or no X   Have you traveled outside the state in the past 14 days?  X   Have you been in contact with someone with a confirmed diagnosis of COVID-19 or PUI in the past 14 days without wearing appropriate PPE?  X   Have you been living in the same home as a person with confirmed diagnosis of COVID-19 or a PUI (household contact)?    X   Have you been diagnosed with COVID-19?    X              What to do next: Answered NO to all: Answered YES to anything:   Proceed with unit schedule Follow the [facility] Inpatient Flowsheet.   
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
## 2019-03-22 NOTE — Progress Notes (Signed)
   03/22/19 2316  COVID-19 Daily Checkoff  Have you had a fever (temp > 37.80C/100F)  in the past 24 hours?  No  If you have had runny nose, nasal congestion, sneezing in the past 24 hours, has it worsened? No  COVID-19 EXPOSURE  Have you traveled outside the state in the past 14 days? No  Have you been in contact with someone with a confirmed diagnosis of COVID-19 or PUI in the past 14 days without wearing appropriate PPE? No  Have you been living in the same home as a person with confirmed diagnosis of COVID-19 or a PUI (household contact)? No  Have you been diagnosed with COVID-19? No

## 2019-03-22 NOTE — [facility] Group Notes (Signed)
Adult Psychoeducational Group Note  Date:  03/22/2019 Time:  4:53 PM  Group Topic/Focus:  Progressive Relaxation:  Learning how to deep breathe and to tighten and relax muscles in the bocy. Also used is Imagery of being on a beach  Participation Level:  Active  Participation Quality:  Appropriate  Affect:  Flat  Cognitive:  Oriented  Insight: Good  Engagement in Group:  Engaged  Modes of Intervention:  Activity, Education, Role-play and Support  Additional Comments:  Pt partisipated fully in the group Vira Blanco A 03/22/2019, 4:53 PM

## 2019-03-22 NOTE — [facility] Group Notes (Signed)
Adult Psychoeducational Group Note  Date:  03/22/2019 Time:  4:55 PM  Group Topic/Focus:  Making Healthy Choices:   The focus of this group is to help patients identify negative/unhealthy choices they were using prior to admission and identify positive/healthier coping strategies to replace them upon discharge.  Participation Level:  Active  Participation Quality:  Appropriate  Affect:  Appropriate  Cognitive:  Appropriate  Insight: Good  Engagement in Group:  Engaged  Modes of Intervention:  Activity, Discussion, Role-play and Support  Additional Comments:  Pt was fully engaged in this group. States he learned a lot.  Vira Blanco A 03/22/2019, 4:55 PM

## 2019-03-22 NOTE — Progress Notes (Signed)
   03/22/19 2321  Psych Admission Type (Psych Patients Only)  Admission Status Involuntary  Psychosocial Assessment  Patient Complaints None  Eye Contact Brief  Facial Expression Flat  Affect Appropriate to circumstance  Speech Logical/coherent  Interaction Minimal  Motor Activity Other (Comment) (WDL)  Appearance/Hygiene Unremarkable  Behavior Characteristics Appropriate to situation  Mood Anxious  Thought Process  Coherency WDL  Content WDL  Delusions None reported or observed  Perception WDL  Hallucination None reported or observed  Judgment Poor  Confusion None  Danger to Self  Current suicidal ideation? Denies  Danger to Others  Danger to Others None reported or observed

## 2019-03-23 DIAGNOSIS — F1010 Alcohol abuse, uncomplicated: Secondary | ICD-10-CM

## 2019-03-23 NOTE — Progress Notes (Signed)
NUTRITION ASSESSMENT RD working remotely.   Pt identified as at risk on the Malnutrition Screen Tool  INTERVENTION: - continue Ensure Enlive BID, each supplement provides 350 kcal and 20 grams of protein. - continue to encourage PO intakes.   NUTRITION DIAGNOSIS: Unintentional weight loss related to sub-optimal intake as evidenced by pt report.   Goal: Pt to meet >/= 90% of their estimated nutrition needs.  Monitor:  PO intake  Assessment:  Patient admitted for alcohol-induced mood disorder. He was [facility] to [facility] by [employer] after making several suicidal statements at home. He had been sober x2 years until about a week PTA.   Current weight is 136 lb and weight on 08/12/18 was 148 lb. This indicates 12 lb weight loss (8% body weight) in the past 7 months; not significant for time frame, but unsure if weight loss has occurred more acutely.   Ensure Enlive was ordered BID yesterday and patient has accepted both bottles of supplement offered to him.   29 y.o. male  Height: Ht Readings from Last 1 Encounters:  03/21/19 5\' 10"  (1.778 m)    Weight: Wt Readings from Last 1 Encounters:  03/21/19 61.7 kg    Weight Hx: Wt Readings from Last 10 Encounters:  03/21/19 61.7 kg  03/21/19 67.1 kg  08/12/18 67.3 kg  08/08/13 59 kg  01/27/13 58.1 kg  08/01/12 55.5 kg  12/30/10 53.5 kg (3 %, Z= -1.85)*   * Growth percentiles are based on CDC (Boys, 2-20 Years) data.    BMI:  Body mass index is 19.51 kg/m. Pt meets criteria for normal weight based on current BMI.  Estimated Nutritional Needs: Kcal: 25-30 kcal/kg Protein: > 1 gram protein/kg Fluid: 1 ml/kcal  Diet Order:  Diet Order    None     Pt is also offered choice of unit snacks mid-morning and mid-afternoon.  Pt is eating as desired.   Lab results and medications reviewed.     01/01/11, MS, RD, LDN, CNSC Inpatient Clinical Dietitian RD pager # available in [facility]  After hours/weekend pager # available in  [facility]

## 2019-03-23 NOTE — [facility] Group Notes (Signed)
LCSW Group Therapy Note 03/23/2019 3:15 PM  Type of Therapy and Topic: Group Therapy: Overcoming Obstacles  Participation Level: Active  Description of Group:  In this group patients will be encouraged to explore what they see as obstacles to their own wellness and recovery. They will be guided to discuss their thoughts, feelings, and behaviors related to these obstacles. The group will process together ways to cope with barriers, with attention given to specific choices patients can make. Each patient will be challenged to identify changes they are motivated to make in order to overcome their obstacles. This group will be process-oriented, with patients participating in exploration of their own experiences as well as giving and receiving support and challenge from other group members.  Therapeutic Goals: 1. Patient will identify personal and current obstacles as they relate to admission. 2. Patient will identify barriers that currently interfere with their wellness or overcoming obstacles.  3. Patient will identify feelings, thought process and behaviors related to these barriers. 4. Patient will identify two changes they are willing to make to overcome these obstacles:   Summary of Patient Progress   Christian Miranda was engaged and participated throughout the group session. Christian Miranda shared that his main obstacle is is lack communication. He reports he typically does not communicate and instead "bottles up my emotions". Christian Miranda shared with the group that he does not feel comfortable, nor feels he should express how he feels. Christian Miranda states that he plans to get a therapist to help him process his feelings.    Therapeutic Modalities:  Cognitive Behavioral Therapy Solution Focused Therapy Motivational Interviewing Relapse Prevention Therapy   Alcario Drought Clinical Social Worker

## 2019-03-23 NOTE — Progress Notes (Signed)
Recreation Therapy Notes  Date:  3.8.21 Time: 0930 Location: [address] Dayroom  Group Topic: Stress Management  Goal Area(s) Addresses:  Patient will identify positive stress management techniques. Patient will identify benefits of using stress management post d/c.  Behavioral Response:  Engaged  Intervention: Stress Management  Activity :  Meditation.  LRT played a meditation on being resilient in the face of adversity.  Patients were to focus and listen to the meditation as it played to fully engage in activity.    Education:  Stress Management, Discharge Planning.   Education Outcome: Acknowledges Education  Clinical Observations/Feedback:  Pt attended and participated in activity.     Caroll Rancher, LRT/CTRS         Caroll Rancher A 03/23/2019 11:45 AM

## 2019-03-23 NOTE — Progress Notes (Addendum)
ADULT GRIEF GROUP NOTE:  Spiritual care group on grief and loss facilitated by chaplain Burnis Kingfisher  Group Goal:  Support / Education around grief and loss  Members engage in facilitated group support and psycho-social education.  Group Description:  Following introductions and group rules, group members engaged in facilitated group dialog and support around topic of loss, with particular support around experiences of loss in their lives. Group Identified types of loss (relationships / self / things) and identified patterns, circumstances, and changes that precipitate losses. Reflected on thoughts / feelings around loss, normalized grief responses, and recognized variety in grief experience.  Patient Progress: Present throughout group.  Alert and oriented, he engaged in conversation around elements that keep him isolated in grief.  He noted that he felt the need to care for others around him - describing caring for his grandmother at the age of 63 through cancer.  At her death, he was not allowed to be present with her and felt he had to hold his emotional responses alone.  He described consumption of ETOH as numbing and noted that recently, this had been an "excuse" that allowed him to express anger and sadness - describing going through "explosions" when drinking. He spoke with others in group about practices for caring for himself differently and coping with emotions.
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
## 2019-03-23 NOTE — Progress Notes (Signed)
D:  Patient's self inventory sheet, patient sleeps good, no sleep medication.  Good appetite, normal energy level, good concentration.  Rated depression 2, denied hopeless and anxiety.  Denied withdrawals.  Denied SI.  Denied physical problems.  Denied physical pain.  Goal is figure out discharge, start working on myself and relationships.  Plans to talk with MD.  Does have discharge plans. A:  Medications administered per MD orders.  Emotional support and encouragement given patient. R:  Denied SI and HI, contracts for safety.  Denied A/V hallucinations.  Safety maintained with 15 minute checks.

## 2019-03-23 NOTE — Tx Team (Signed)
Interdisciplinary Treatment and Diagnostic Plan Update  03/23/2019 Time of Session:  Christian Miranda MRN: [id number]  Principal Diagnosis: <principal problem not specified>  Secondary Diagnoses: Active Problems:   MDD (major depressive disorder)   Current Medications:  Current Facility-Administered Medications  Medication Dose Route Frequency Provider Last Rate Last Admin  . acetaminophen (TYLENOL) tablet 650 mg  650 mg Oral Q6H PRN Derrill Center, NP   650 mg at 03/23/19 0826  . feeding supplement (ENSURE ENLIVE) (ENSURE ENLIVE) liquid 237 mL  237 mL Oral BID BM Cobos, Fernando A, MD   237 mL at 03/22/19 1451  . hydrOXYzine (ATARAX/VISTARIL) tablet 25 mg  25 mg Oral Q6H PRN Cobos, Myer Peer, MD      . LORazepam (ATIVAN) tablet 1 mg  1 mg Oral Q6H PRN Cobos, Myer Peer, MD      . multivitamin with minerals tablet 1 tablet  1 tablet Oral Daily Cobos, Myer Peer, MD   1 tablet at 03/23/19 0829  . nicotine (NICODERM CQ - dosed in mg/24 hours) patch 21 mg  21 mg Transdermal Daily Cobos, Myer Peer, MD   21 mg at 03/23/19 0829  . thiamine (B-1) injection 100 mg  100 mg Intramuscular Once Cobos, Fernando A, MD      . thiamine tablet 100 mg  100 mg Oral Daily Cobos, Myer Peer, MD   100 mg at 03/23/19 0829  . traZODone (DESYREL) tablet 50 mg  50 mg Oral QHS PRN Derrill Center, NP   50 mg at 03/22/19 2104   PTA Medications: No medications prior to admission.    Patient Stressors: Loss of relationship (gf) Substance abuse  Patient Strengths: Average or above average intelligence Warehouse manager means Physical Health Supportive family/friends  Treatment Modalities: Medication Management, Group therapy, Case management,  1 to 1 session with clinician, Psychoeducation, Recreational therapy.   Physician Treatment Plan for Primary Diagnosis: <principal problem not specified> Long Term Goal(s): Improvement in symptoms so as ready for discharge Improvement in symptoms  so as ready for discharge   Short Term Goals: Ability to identify changes in lifestyle to reduce recurrence of condition will improve Ability to verbalize feelings will improve Ability to disclose and discuss suicidal ideas Ability to demonstrate self-control will improve Compliance with prescribed medications will improve Ability to identify changes in lifestyle to reduce recurrence of condition will improve Ability to verbalize feelings will improve Ability to disclose and discuss suicidal ideas Ability to demonstrate self-control will improve Ability to identify and develop effective coping behaviors will improve Ability to maintain clinical measurements within normal limits will improve Compliance with prescribed medications will improve  Medication Management: Evaluate patient's response, side effects, and tolerance of medication regimen.  Therapeutic Interventions: 1 to 1 sessions, Unit Group sessions and Medication administration.  Evaluation of Outcomes: Progressing  Physician Treatment Plan for Secondary Diagnosis: Active Problems:   MDD (major depressive disorder)  Long Term Goal(s): Improvement in symptoms so as ready for discharge Improvement in symptoms so as ready for discharge   Short Term Goals: Ability to identify changes in lifestyle to reduce recurrence of condition will improve Ability to verbalize feelings will improve Ability to disclose and discuss suicidal ideas Ability to demonstrate self-control will improve Compliance with prescribed medications will improve Ability to identify changes in lifestyle to reduce recurrence of condition will improve Ability to verbalize feelings will improve Ability to disclose and discuss suicidal ideas Ability to demonstrate self-control will improve Ability to identify and develop  effective coping behaviors will improve Ability to maintain clinical measurements within normal limits will improve Compliance with prescribed  medications will improve     Medication Management: Evaluate patient's response, side effects, and tolerance of medication regimen.  Therapeutic Interventions: 1 to 1 sessions, Unit Group sessions and Medication administration.  Evaluation of Outcomes: Progressing   RN Treatment Plan for Primary Diagnosis: <principal problem not specified> Long Term Goal(s): Knowledge of disease and therapeutic regimen to maintain health will improve  Short Term Goals: Ability to participate in decision making will improve, Ability to disclose and discuss suicidal ideas, Ability to identify and develop effective coping behaviors will improve and Compliance with prescribed medications will improve  Medication Management: RN will administer medications as ordered by provider, will assess and evaluate patient's response and provide education to patient for prescribed medication. RN will report any adverse and/or side effects to prescribing provider.  Therapeutic Interventions: 1 on 1 counseling sessions, Psychoeducation, Medication administration, Evaluate responses to treatment, Monitor vital signs and CBGs as ordered, Perform/monitor CIWA, COWS, AIMS and Fall Risk screenings as ordered, Perform wound care treatments as ordered.  Evaluation of Outcomes: Progressing   LCSW Treatment Plan for Primary Diagnosis: <principal problem not specified> Long Term Goal(s): Safe transition to appropriate next level of care at discharge, Engage patient in therapeutic group addressing interpersonal concerns.  Short Term Goals: Engage patient in aftercare planning with referrals and resources  Therapeutic Interventions: Assess for all discharge needs, 1 to 1 time with Social worker, Explore available resources and support systems, Assess for adequacy in community support network, Educate family and significant other(s) on suicide prevention, Complete Psychosocial Assessment, Interpersonal group therapy.  Evaluation of  Outcomes: Progressing   Progress in Treatment: Attending groups: Yes. Participating in groups: Yes. Taking medication as prescribed: Yes. Toleration medication: Yes. Family/Significant other contact made: No, will contact:  the patient's mother Patient understands diagnosis: Yes. Discussing patient identified problems/goals with staff: Yes. Medical problems stabilized or resolved: Yes. Denies suicidal/homicidal ideation: Yes. Issues/concerns per patient self-inventory: No. Other:   New problem(s) identified: None   New Short Term/Long Term Goal(s):Detox, medication stabilization, elimination of SI thoughts, development of comprehensive mental wellness plan.    Patient Goals:    Discharge Plan or Barriers: Patient recently admitted. CSW will continue to follow and assess for appropriate referrals and possible discharge planning.    Reason for Continuation of Hospitalization: Anxiety Depression Medication stabilization Suicidal ideation  Estimated Length of Stay: 3-5 days   Attendees: Patient: 03/23/2019 9:35 AM  Physician:  03/23/2019 9:35 AM  Nursing:  03/23/2019 9:35 AM  RN Care Manager: 03/23/2019 9:35 AM  Social Worker: Baldo Daub, LCSW 03/23/2019 9:35 AM  Recreational Therapist:  03/23/2019 9:35 AM  Other:  03/23/2019 9:35 AM  Other:  03/23/2019 9:35 AM  Other: 03/23/2019 9:35 AM    Scribe for Treatment Team: Maeola Sarah, LCSWA 03/23/2019 9:35 AM

## 2019-03-23 NOTE — Progress Notes (Signed)
   03/23/19 2300  Psych Admission Type (Psych Patients Only)  Admission Status Involuntary  Psychosocial Assessment  Patient Complaints Depression  Eye Contact Brief  Facial Expression Flat  Affect Appropriate to circumstance  Speech Logical/coherent  Interaction Minimal  Motor Activity Other (Comment) (WDL)  Appearance/Hygiene Unremarkable  Behavior Characteristics Anxious  Mood Depressed  Thought Process  Coherency WDL  Content WDL  Delusions None reported or observed  Perception WDL  Hallucination None reported or observed  Judgment Poor  Confusion None  Danger to Self  Current suicidal ideation? Denies  Danger to Others  Danger to Others None reported or observed

## 2019-03-23 NOTE — Progress Notes (Signed)
The patient learned today to address his communication skills. His goal for tomorrow is to get discharged.
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
## 2019-03-24 MED ORDER — TRAZODONE HCL 50 MG PO TABS: 50.0000 mg | ORAL_TABLET | Freq: Every evening | ORAL | 0 refills | Status: AC | PRN

## 2019-03-24 MED ORDER — NICOTINE 21 MG/24HR TD PT24: 21.0000 mg | MEDICATED_PATCH | Freq: Every day | TRANSDERMAL | 0 refills | Status: DC

## 2019-03-24 NOTE — Discharge Instructions (Signed)
Your outpatient appointments will be "virtual". You will receive a WebEx link from your outpatient office or a telephone call. If you do not receive a link or receive a phone call, please call the office.
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
## 2019-03-24 NOTE — Progress Notes (Signed)
  [facility] Adult Case Management Discharge Plan :  Will you be returning to the same living situation after discharge:  Yes,  patient is returning home with his girlfriend At discharge, do you have transportation home?: Yes,  patient reports his mother is picking him up Do you have the ability to pay for your medications: Yes,  [employer]  Release of information consent forms completed and in the chart;  Patient's signature needed at discharge.  Patient to Follow up at: Follow-up Information    [facility] Follow up on 03/26/2019.   Specialty: Behavioral Health Why: You are scheduled for an appointment on 03/26/19 at 4:00 pm for therapy.  You are also scheduled for medication management on 04/07/19 at 1:00 pm.  These are virtual appointments.  Please have your insurance and your discharge paperwork available   Contact information: [address] [place] [address] [phone]          Next level of care provider has access to [facility] Link:yes  Safety Planning and Suicide Prevention discussed: Yes,  with the patient's mother  Has patient been referred to the Quitline?: Patient refused referral  Patient has been referred for addiction treatment: Pt. refused referral  Maeola Sarah, LCSWA 03/24/2019, 9:24 AM

## 2019-03-24 NOTE — Progress Notes (Signed)
D:  Patient's self inventory sheet, patient sleeps good, no sleep medication given.  Good appetite, normal energy level, good concentration.  Denied depression, anxiety and hopeless.  Denied withdrawals.  Denied SI.  Denied physical problems.  Denied physical pain.  Goal is discharge.  Plans to talk to MD.  Does have discharge plans. A::  Medications administered per MD orders.  Emotional support and encouragement given patient. R:  Denied SI and HI, contracts for safety.  Denied A/V hallucinations.  Safety maintained with 15 minute checks.

## 2019-03-24 NOTE — Progress Notes (Addendum)
Discharge Note:  Patient discharged home.  Denied SI and HI.  Denied A/V hallucinations.  Suicide prevention information given and discussed with patient who stated he understood and had no questions.  Patient stated he received all his belongings, clothing, toiletries, misc items, etc.  Patient stated he appreciated all assistance received from [facility] staff.  All required discharge information given at discharge.   

## 2019-03-24 NOTE — [facility] Suicide Risk Assessment (Signed)
[facility] Discharge Suicide Risk Assessment   Principal Problem: Alcohol abuse Discharge Diagnoses: Principal Problem:   Alcohol abuse Active Problems:   MDD (major depressive disorder)   Total Time spent with patient: 15 minutes  Musculoskeletal: Strength & Muscle Tone: within normal limits Gait & Station: normal Patient leans: N/A  Psychiatric Specialty Exam: Review of Systems  All other systems reviewed and are negative.   Blood pressure 122/84, pulse 90, temperature (!) 97.4 F (36.3 C), temperature source Oral, resp. rate 16, height 5\' 10"  (1.778 m), weight 61.7 kg, SpO2 99 %.Body mass index is 19.51 kg/m.  General Appearance: Casual  Eye Contact::  Good  Speech:  Normal Rate409  Volume:  Normal  Mood:  Euthymic  Affect:  Congruent  Thought Process:  Coherent and Descriptions of Associations: Intact  Orientation:  Full (Time, Place, and Person)  Thought Content:  Logical  Suicidal Thoughts:  No  Homicidal Thoughts:  No  Memory:  Immediate;   Good Recent;   Good Remote;   Good  Judgement:  Intact  Insight:  Fair  Psychomotor Activity:  Normal  Concentration:  Good  Recall:  Good  Fund of Knowledge:Good  Language: Good  Akathisia:  Negative  Handed:  Right  AIMS (if indicated):     Assets:  Communication Skills Desire for Improvement Financial Resources/Insurance Housing Resilience Social Support Talents/Skills Transportation  Sleep:  Number of Hours: 6.5  Cognition: WNL  ADL's:  Intact   Mental Status Per Nursing Assessment::   On Admission:  Suicidal ideation indicated by others  Demographic Factors:  Male and Caucasian  Loss Factors: NA  Historical Factors: Impulsivity  Risk Reduction Factors:   Living with another person, especially a relative and Positive social support  Continued Clinical Symptoms:  Depression:   Comorbid alcohol abuse/dependence Impulsivity Alcohol/Substance Abuse/Dependencies  Cognitive Features That Contribute To  Risk:  None    Suicide Risk:  Minimal: No identifiable suicidal ideation.  Patients presenting with no risk factors but with morbid ruminations; may be classified as minimal risk based on the severity of the depressive symptoms  Follow-up Information    [facility] Follow up on 03/26/2019.   Specialty: Behavioral Health Why: You are scheduled for an appointment on 03/26/19 at 4:00 pm for therapy.  You are also scheduled for medication management on 04/07/19 at 1:00 pm.  These are virtual appointments.  Please have your insurance and your discharge paperwork available   Contact information: [address] [place] [phone]          Plan Of Care/Follow-up recommendations:  Activity:  ad lib  [phone], MD 03/24/2019, 10:38 AM

## 2019-03-26 ENCOUNTER — Ambulatory Visit (HOSPITAL_COMMUNITY): Payer: 59 | Admitting: Psychiatry

## 2019-03-31 ENCOUNTER — Other Ambulatory Visit: Payer: Self-pay

## 2019-03-31 ENCOUNTER — Ambulatory Visit (INDEPENDENT_AMBULATORY_CARE_PROVIDER_SITE_OTHER): Payer: 59 | Admitting: Psychiatry

## 2019-03-31 ENCOUNTER — Encounter (HOSPITAL_COMMUNITY): Payer: Self-pay | Admitting: Psychiatry

## 2019-03-31 VITALS — Wt 138.0 lb

## 2019-03-31 DIAGNOSIS — F39 Unspecified mood [affective] disorder: Secondary | ICD-10-CM | POA: Diagnosis not present

## 2019-03-31 DIAGNOSIS — F1010 Alcohol abuse, uncomplicated: Secondary | ICD-10-CM | POA: Diagnosis not present

## 2019-03-31 MED ORDER — LAMOTRIGINE 25 MG PO TABS: ORAL_TABLET | ORAL | 1 refills | Status: DC
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
## 2019-04-07 ENCOUNTER — Ambulatory Visit (HOSPITAL_COMMUNITY): Payer: 59 | Admitting: Psychiatry

## 2019-04-08 ENCOUNTER — Encounter (HOSPITAL_COMMUNITY): Payer: Self-pay

## 2019-04-08 DIAGNOSIS — F320 Major depressive disorder, single episode, mild: Secondary | ICD-10-CM

## 2019-04-08 NOTE — BH Assessment (Signed)
Q-Actual   Writer left a voice mail message on 03-27-2019, 04-01-2019 and 04-06-2019.  There are no wellness checks for this patient.

## 2019-04-10 ENCOUNTER — Ambulatory Visit (HOSPITAL_COMMUNITY): Payer: 59 | Admitting: Psychiatry

## 2019-04-13 ENCOUNTER — Ambulatory Visit (HOSPITAL_COMMUNITY): Payer: 59 | Admitting: Psychiatry

## 2019-04-13 ENCOUNTER — Encounter (HOSPITAL_COMMUNITY): Payer: Self-pay

## 2019-04-13 DIAGNOSIS — F320 Major depressive disorder, single episode, mild: Secondary | ICD-10-CM

## 2019-04-13 NOTE — BH Assessment (Signed)
Q-Actual   Writer left a voice mail message on 04-11-2019.  There are no wellness checks for this patient.

## 2019-04-23 ENCOUNTER — Ambulatory Visit (INDEPENDENT_AMBULATORY_CARE_PROVIDER_SITE_OTHER): Payer: 59 | Admitting: Psychiatry

## 2019-04-23 ENCOUNTER — Encounter (HOSPITAL_COMMUNITY): Payer: Self-pay | Admitting: Psychiatry

## 2019-04-23 ENCOUNTER — Other Ambulatory Visit: Payer: Self-pay

## 2019-04-23 DIAGNOSIS — F39 Unspecified mood [affective] disorder: Secondary | ICD-10-CM | POA: Diagnosis not present

## 2019-04-23 DIAGNOSIS — F1010 Alcohol abuse, uncomplicated: Secondary | ICD-10-CM | POA: Diagnosis not present

## 2019-04-23 DIAGNOSIS — G43009 Migraine without aura, not intractable, without status migrainosus: Secondary | ICD-10-CM

## 2019-04-23 MED ORDER — TOPIRAMATE 25 MG PO TABS: 25.0000 mg | ORAL_TABLET | Freq: Every day | ORAL | 0 refills | Status: DC

## 2019-04-23 MED ORDER — LAMOTRIGINE 25 MG PO TABS: ORAL_TABLET | ORAL | 1 refills | Status: DC
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
## 2019-04-28 ENCOUNTER — Encounter (HOSPITAL_COMMUNITY): Payer: Self-pay

## 2019-05-14 ENCOUNTER — Ambulatory Visit (HOSPITAL_COMMUNITY): Payer: 59 | Admitting: Psychiatry

## 2019-05-21 ENCOUNTER — Other Ambulatory Visit: Payer: Self-pay

## 2019-05-21 ENCOUNTER — Encounter (HOSPITAL_COMMUNITY): Payer: Self-pay | Admitting: Psychiatry

## 2019-05-21 ENCOUNTER — Telehealth (INDEPENDENT_AMBULATORY_CARE_PROVIDER_SITE_OTHER): Payer: 59 | Admitting: Psychiatry

## 2019-05-21 DIAGNOSIS — F39 Unspecified mood [affective] disorder: Secondary | ICD-10-CM | POA: Diagnosis not present

## 2019-05-21 DIAGNOSIS — F1010 Alcohol abuse, uncomplicated: Secondary | ICD-10-CM | POA: Diagnosis not present
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
## 2019-08-18 ENCOUNTER — Other Ambulatory Visit: Payer: Self-pay

## 2019-08-18 ENCOUNTER — Telehealth (INDEPENDENT_AMBULATORY_CARE_PROVIDER_SITE_OTHER): Payer: 59 | Admitting: Psychiatry

## 2019-08-18 ENCOUNTER — Encounter (HOSPITAL_COMMUNITY): Payer: Self-pay | Admitting: Psychiatry

## 2019-08-18 DIAGNOSIS — F39 Unspecified mood [affective] disorder: Secondary | ICD-10-CM | POA: Diagnosis not present

## 2019-08-18 NOTE — Progress Notes (Signed)
Virtual Visit via Telephone Note  I connected with Christian Miranda on 08/18/19 at  3:00 PM EDT by telephone and verified that I am speaking with the correct person using two identifiers.  Location: Patient: work Provider: home office   I discussed the limitations, risks, security and privacy concerns of performing an evaluation and management service by telephone and the availability of in person appointments. I also discussed with the patient that there may be a patient responsible charge related to this service. The patient expressed understanding and agreed to proceed.   History of Present Illness: Patient is evaluated by phone session.  He is still not taking any medication and is been more than few months that he feels his mood is stable.  He has been very busy at work and recently got promoted and doing 12-hour shift.  He is also not in therapy with Breanna checkup at [facility] because of busy schedule.  He does not feel he needs medication and therapy since things are going well.  He denies any anger, mood swings, irritability and is getting along with a coworker and family member.  Patient reported he is obviously broke up with his girlfriend but is still once in a while see each other as a friend.  He had a good support from his parents who lives close by.  He is not using any drugs or alcohol.  He likes his job and keep himself busy.  His headaches are not as bad and stable.  Denies any crying spells or any feeling of hopelessness or any suicidal thoughts.  His energy level is okay.    Past Psychiatric History: H/O road rage, anger, severe mood swings. H/O twice DUI and assault charges. H/O fighting at the bar. H/O CD IOP in 2015 and h/o inpatient under IVC in March 2021 after making suicidal statements and alcohol intoxication.    Psychiatric Specialty Exam: Physical Exam  Review of Systems  There were no vitals taken for this visit.There is no height or weight on file to  calculate BMI.  General Appearance: NA  Eye Contact:  NA  Speech:  Clear and Coherent  Volume:  Normal  Mood:  Euthymic  Affect:  NA  Thought Process:  Goal Directed  Orientation:  Full (Time, Place, and Person)  Thought Content:  WDL  Suicidal Thoughts:  No  Homicidal Thoughts:  No  Memory:  Immediate;   Good Recent;   Good Remote;   Good  Judgement:  Good  Insight:  Good  Psychomotor Activity:  NA  Concentration:  Concentration: Good and Attention Span: Good  Recall:  Good  Fund of Knowledge:  Good  Language:  Good  Akathisia:  No  Handed:  Right  AIMS (if indicated):     Assets:  Communication Skills Desire for Improvement Housing Physical Health Resilience Social Support Talents/Skills Transportation  ADL's:  Intact  Cognition:  WNL  Sleep:   good      Assessment and Plan: Mood disorder NOS.  History of alcohol abuse.  Patient not taking any medication and doing well without any medicine in therapy.  He wants to stay away from the medication for now since he does not feel he needed.  He is happy with his job and making progress.  I agree with the plan.  We will not schedule any more appointments however I recommend to call us back if he feels he needs to be on medication or need to be seen.  Discussed  safety concern that anytime having active suicidal thoughts or homicidal thought that he need to call 911 or go to local emergency room.  Follow Up Instructions:    I discussed the assessment and treatment plan with the patient. The patient was provided an opportunity to ask questions and all were answered. The patient agreed with the plan and demonstrated an understanding of the instructions.   The patient was advised to call back or seek an in-person evaluation if the symptoms worsen or if the condition fails to improve as anticipated.  I provided 10 minutes of non-face-to-face time during this encounter.   Cleotis Nipper, MD

## 2020-02-17 ENCOUNTER — Telehealth: Payer: Self-pay | Admitting: Neurology

## 2020-02-17 NOTE — Telephone Encounter (Signed)
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  Pt called asking for a f/u appointment on his migraines, since not seen since 2020 phone rep asked if pt had any new symptoms, pt said no.  He has been scheduled with Dr Epimenio Foot (Amy,NP next avail was beyond Dr Bonnita Hollow next available).  No call back requested.

## 2020-04-27 ENCOUNTER — Ambulatory Visit (INDEPENDENT_AMBULATORY_CARE_PROVIDER_SITE_OTHER): Payer: 59 | Admitting: Neurology

## 2020-04-27 ENCOUNTER — Encounter: Payer: Self-pay | Admitting: Neurology

## 2020-04-27 VITALS — BP 110/70 | HR 66 | Ht 70.0 in | Wt 144.5 lb

## 2020-04-27 DIAGNOSIS — G8929 Other chronic pain: Secondary | ICD-10-CM | POA: Diagnosis not present

## 2020-04-27 DIAGNOSIS — M542 Cervicalgia: Secondary | ICD-10-CM | POA: Diagnosis not present

## 2020-04-27 DIAGNOSIS — R519 Headache, unspecified: Secondary | ICD-10-CM

## 2020-04-27 DIAGNOSIS — G43009 Migraine without aura, not intractable, without status migrainosus: Secondary | ICD-10-CM

## 2020-04-27 MED ORDER — UBROGEPANT 100 MG PO TABS: 100.0000 mg | ORAL_TABLET | ORAL | 5 refills | Status: AC | PRN

## 2020-04-27 MED ORDER — TOPIRAMATE 50 MG PO TABS: ORAL_TABLET | ORAL | 11 refills | Status: AC

## 2020-04-27 NOTE — Progress Notes (Signed)
[facility]  PATIENT: Christian Miranda DOB: 1990-05-03  REFERRING DOCTOR OR PCP: Moshe Cipro, FNP/John Margo Aye, MD SOURCE: Patient, Notes from Dr. Margo Aye  _________________________________   HISTORICAL  CHIEF COMPLAINT:  Chief Complaint  Patient presents with  . Follow-up    RM 13, alone. Last seen 08/12/2018. Here to follow up on migraines. They have become more frequent. Christian Miranda does not like coming to the doctor but decided to follow up d/t migraines becoming worse. Christian Miranda ran out of topamax, trazodone, Ubrelvy. 50mg  total of topamax was effective when Christian Miranda was on it. Trazodone helped him sleep off migraine. was effective.    HISTORY OF PRESENT ILLNESS:  Christian Miranda is a 30 y.o. man with headaches.  Update 04/27/2020: Christian Miranda has had more HA the past year.   Christian Miranda has had 1-2 a week.  Pain is often right occipital at the onset and then becomes bilateral .   Pain is throbbing and intense.    Christian Miranda denies nausea or vomiting.  Christian Miranda has photophobia and phonophobia.  Christian Miranda feels his vision whites out when a severe HA occurs.    Christian Miranda does not note much difference with movements.  Changes in barometric pressure sometime trigger a headache.         When a HA occurs Christian Miranda takes 800 mg Motrin a few times.   Christian Miranda notes some improvment on topiramate 50 mg nightly.   Ubrely helps to stop a HA when one occurs.    Other triptan's (rizatriptan) had not helped.     Christian Miranda has had a couple episodes of lightheadedness and presyncope when pain was more intense.   Christian Miranda sits down and symptoms passed.       Christian Miranda works as a 04/29/2020.  His paternal grandfather and great grandfather had cluster headaches and his mom has migraines.      REVIEW OF SYSTEMS: Constitutional: No fevers, chills, sweats, or change in appetite Eyes: No visual changes, double vision, eye pain Ear, nose and throat: No hearing loss, ear pain, nasal congestion, sore throat Cardiovascular: No chest pain, palpitations Respiratory: No  shortness of breath at rest or with exertion.   No wheezes GastrointestinaI: No nausea, vomiting, diarrhea, abdominal pain, fecal incontinence Genitourinary: No dysuria, urinary retention or frequency.  No nocturia. Musculoskeletal: No neck pain, back pain Integumentary: No rash, pruritus, skin lesions Neurological: as above Psychiatric: No depression at this time.  No anxiety Endocrine: No palpitations, diaphoresis, change in appetite, change in weigh or increased thirst Hematologic/Lymphatic: No anemia, purpura, petechiae. Allergic/Immunologic: No itchy/runny eyes, nasal congestion, recent allergic reactions, rashes  ALLERGIES: Allergies  Allergen Reactions  . Sulfa Antibiotics Hives    HOME MEDICATIONS:  Current Outpatient Medications:  .  topiramate (TOPAMAX) 50 MG tablet, One or two at bedtime, Disp: 60 tablet, Rfl: 11 .  traZODone (DESYREL) 50 MG tablet, Take 1 tablet (50 mg total) by mouth at bedtime as needed for sleep., Disp: 30 tablet, Rfl: 0 .  Ubrogepant 100 MG TABS, Take 100 mg by mouth as needed., Disp: 10 tablet, Rfl: 5  PAST MEDICAL HISTORY: Past Medical History:  Diagnosis Date  . Adolescent behavior problems 08/01/2012  . Asthma     PAST SURGICAL HISTORY: Past Surgical History:  Procedure Laterality Date  . TONSILLECTOMY      FAMILY HISTORY: Family History  Problem Relation Age of Onset  . Alcohol abuse Paternal Aunt   . Alcohol abuse Paternal Uncle   . Alcohol abuse Cousin  SOCIAL HISTORY:  Social History   Socioeconomic History  . Marital status: Single    Spouse name: Not on file  . Number of children: Not on file  . Years of education: Not on file  . Highest education level: Not on file  Occupational History  . Not on file  Tobacco Use  . Smoking status: Current Every Day Smoker    Packs/day: 1.50    Types: Cigarettes  . Smokeless tobacco: Never Used  . Tobacco comment: smokes one cigarette/day. trying to quit  Substance and  Sexual Activity  . Alcohol use: Yes    Alcohol/week: 35.0 standard drinks    Types: 35 Cans of beer per week    Comment: Patient reports Christian Miranda consumes 18 pack of beer daily.   . Drug use: No    Comment: Patient denies  . Sexual activity: Not on file  Other Topics Concern  . Not on file  Social History Narrative   Lives   Caffeine use:    Social Determinants of Health   Financial Resource Strain: Not on file  Food Insecurity: Not on file  Transportation Needs: Not on file  Physical Activity: Not on file  Stress: Not on file  Social Connections: Not on file  Intimate Partner Violence: Not on file     PHYSICAL EXAM  Vitals:   04/27/20 1343  BP: 110/70  Pulse: 66  SpO2: 98%  Weight: 144 lb 8 oz (65.5 kg)  Height: 5\' 10"  (1.778 m)    Body mass index is 20.73 kg/m.   General: The patient is well-developed and well-nourished and in no acute distress  HEENT:  Head is [place]/AT.  Sclera are anicteric.   Neck: Currently there is no tenderness range of motion was normal.  Skin: Extremities are without rash or  edema.  Musculoskeletal:  Back is nontender  Neurologic Exam  Mental status: The patient is alert and oriented x 3 at the time of the examination. The patient has apparent normal recent and remote memory, with an apparently normal attention span and concentration ability.   Speech is normal.  Cranial nerves: Extraocular movements are full.  Facial strength and sensation was normal.  No obvious hearing deficits are noted.  Motor:  Muscle bulk is normal.   Tone is normal. Strength is  5 / 5 in all 4 extremities.   Sensory: Sensory testing is intact to soft touch and vibration sensation in all 4 extremities.  Coordination: Cerebellar testing reveals good finger-nose-finger and heel-to-shin bilaterally.  Gait and station: Station is normal.   Gait is normal. Tandem gait is normal. Romberg is negative.   Reflexes: Deep tendon reflexes are symmetric and normal  bilaterally.       DIAGNOSTIC DATA (LABS, IMAGING, TESTING) - I reviewed patient records, labs, notes, testing and imaging myself where available.  Lab Results  Component Value Date   WBC 11.2 (H) 03/21/2019   HGB 15.8 03/21/2019   HCT 48.9 03/21/2019   MCV 88.1 03/21/2019   PLT 352 03/21/2019      Component Value Date/Time   NA 144 03/21/2019 0627   K 3.9 03/21/2019 0627   CL 109 03/21/2019 0627   CO2 23 03/21/2019 0627   GLUCOSE 103 (H) 03/21/2019 0627   BUN 5 (L) 03/21/2019 0627   CREATININE 0.67 03/21/2019 0627   CALCIUM 8.9 03/21/2019 0627   PROT 8.0 03/21/2019 0627   ALBUMIN 5.0 03/21/2019 0627   AST 25 03/21/2019 0627   ALT 23  03/21/2019 0627   ALKPHOS 27 (L) 03/21/2019 0627   BILITOT 0.5 03/21/2019 0627   GFRNONAA >60 03/21/2019 0627   GFRAA >60 03/21/2019 8280       ASSESSMENT AND PLAN  1. Chronic nonintractable headache, unspecified headache type   2. Neck pain   3. Migraine without aura and without status migrainosus, not intractable     1.   Topiramate 50 mg one or two nightly for migraine prophylaxis.  If not effective enough will consider an anti-CGRP  injection 2.   Bernita Raisin for breakthrough migraines 3.   rtc 3-4 months sooner if new or worsening migraines    Janylah Belgrave A. Epimenio Foot, MD, [facility] 04/27/2020, 2:23 PM Certified in Neurology, Clinical Neurophysiology, Sleep Medicine and Neuroimaging  [facility] [address], [address] [place], [place] [address] [phone]

## 2020-08-03 ENCOUNTER — Encounter: Payer: Self-pay | Admitting: Neurology

## 2020-08-03 ENCOUNTER — Ambulatory Visit (INDEPENDENT_AMBULATORY_CARE_PROVIDER_SITE_OTHER): Payer: 59 | Admitting: Neurology

## 2020-08-03 VITALS — BP 116/72 | HR 58 | Ht 71.0 in | Wt 141.5 lb

## 2020-08-03 DIAGNOSIS — G8929 Other chronic pain: Secondary | ICD-10-CM

## 2020-08-03 DIAGNOSIS — G43009 Migraine without aura, not intractable, without status migrainosus: Secondary | ICD-10-CM

## 2020-08-03 DIAGNOSIS — R519 Headache, unspecified: Secondary | ICD-10-CM | POA: Diagnosis not present

## 2020-08-03 DIAGNOSIS — M542 Cervicalgia: Secondary | ICD-10-CM

## 2020-08-03 MED ORDER — AJOVY 225 MG/1.5ML ~~LOC~~ SOAJ: SUBCUTANEOUS | 4 refills | Status: AC

## 2020-08-03 NOTE — Progress Notes (Signed)
[facility]  PATIENT: Christian Miranda DOB: 12-12-1990  REFERRING DOCTOR OR PCP: Moshe Cipro, FNP/John Margo Aye, MD SOURCE: Patient, Notes from Dr. Margo Aye  _________________________________   HISTORICAL  CHIEF COMPLAINT:  Chief Complaint  Patient presents with   Follow-up    Rm 1, alone. Here for 3 month f/u. Pt states topmax does not work anymore, even if he takes 2 pills. Pt states 60& of all mornings he wakes up with HA, becomes better w/ caffine intake. Triggers are temp. and possibly barometric pressure, and work stress. Pn normally starts back of head and neck. Pn can be behind the eyes causing uncontrollable watery eyes. HA 1-3x week.    HISTORY OF PRESENT ILLNESS:  Christian Miranda is a 30 y.o. man with headaches.  Update 04/27/2020: He reports that he is not getting a benefit from topiramate, even after increasing the dose.   We discussed other options.  He has had more HA the past year.   He has 8 HA a month and when present he cannot work due to the intensity.  Pain is often right occipital at the onset and then becomes bilateral .   Pain is throbbing and intense.    He has  nausea and occasioanl vomiting. Sometimes he feels disoriented.  Other times, he feels lightheaded.  He has photophobia and phonophobia.  HHe get visual changes   He does not note much difference with movements.  Changes in barometric pressure sometime trigger a headache.       Transitioning from the heat to a cold room also triggers an event.    He has had a couple episodes of lightheadedness and presyncope when pain was more intense.   He sits down and symptoms passed.        When a HA occurs he takes 800 mg Motrin a few times.   He notes some improvment on topiramate 50 mg nightly.   Ubrely helps to stop a HA when one occurs.    Other triptan's (rizatriptan) had not helped.     He works as a Chartered certified accountant.  His paternal grandfather and great grandfather had cluster headaches and  his mom has migraines.      REVIEW OF SYSTEMS: Constitutional: No fevers, chills, sweats, or change in appetite Eyes: No visual changes, double vision, eye pain Ear, nose and throat: No hearing loss, ear pain, nasal congestion, sore throat Cardiovascular: No chest pain, palpitations Respiratory:  No shortness of breath at rest or with exertion.   No wheezes GastrointestinaI: No nausea, vomiting, diarrhea, abdominal pain, fecal incontinence Genitourinary:  No dysuria, urinary retention or frequency.  No nocturia. Musculoskeletal:  No neck pain, back pain Integumentary: No rash, pruritus, skin lesions Neurological: as above Psychiatric: No depression at this time.  No anxiety Endocrine: No palpitations, diaphoresis, change in appetite, change in weigh or increased thirst Hematologic/Lymphatic:  No anemia, purpura, petechiae. Allergic/Immunologic: No itchy/runny eyes, nasal congestion, recent allergic reactions, rashes  ALLERGIES: Allergies  Allergen Reactions   Sulfa Antibiotics Hives    HOME MEDICATIONS:  Current Outpatient Medications:    Fremanezumab-vfrm (AJOVY) 225 MG/1.5ML SOAJ, 1.5 mL subcutaneous q 4 weeks, Disp: 4.5 mL, Rfl: 4   topiramate (TOPAMAX) 50 MG tablet, One or two at bedtime, Disp: 60 tablet, Rfl: 11   traZODone (DESYREL) 50 MG tablet, Take 1 tablet (50 mg total) by mouth at bedtime as needed for sleep., Disp: 30 tablet, Rfl: 0   Ubrogepant 100 MG TABS, Take 100 mg by mouth  as needed., Disp: 10 tablet, Rfl: 5  PAST MEDICAL HISTORY: Past Medical History:  Diagnosis Date   Adolescent behavior problems 08/01/2012   Asthma     PAST SURGICAL HISTORY: Past Surgical History:  Procedure Laterality Date   TONSILLECTOMY      FAMILY HISTORY: Family History  Problem Relation Age of Onset   Alcohol abuse Paternal Aunt    Alcohol abuse Paternal Uncle    Alcohol abuse Cousin     SOCIAL HISTORY:  Social History   Socioeconomic History   Marital status: Single     Spouse name: Not on file   Number of children: Not on file   Years of education: Not on file   Highest education level: Not on file  Occupational History   Not on file  Tobacco Use   Smoking status: Every Day    Packs/day: 1.50    Types: Cigarettes   Smokeless tobacco: Never   Tobacco comments:    smokes one cigarette/day. trying to quit  Substance and Sexual Activity   Alcohol use: Yes    Alcohol/week: 35.0 standard drinks    Types: 35 Cans of beer per week    Comment: Patient reports he consumes 18 pack of beer daily.    Drug use: No    Comment: Patient denies   Sexual activity: Not on file  Other Topics Concern   Not on file  Social History Narrative   Lives   Caffeine use:    Social Determinants of Health   Financial Resource Strain: Not on file  Food Insecurity: Not on file  Transportation Needs: Not on file  Physical Activity: Not on file  Stress: Not on file  Social Connections: Not on file  Intimate Partner Violence: Not on file     PHYSICAL EXAM  Vitals:   08/03/20 1448  BP: 116/72  Pulse: (!) 58  Weight: 141 lb 8 oz (64.2 kg)  Height: 5\' 11"  (1.803 m)    Body mass index is 19.74 kg/m.   General: The patient is well-developed and well-nourished and in no acute distress  HEENT:  Head is [place]/AT.  Sclera are anicteric.   Neck: Currently there is mild occipital tenderness.  The range of motion was normal.  Skin: Extremities are without rash or  edema.  Neurologic Exam  Mental status: The patient is alert and oriented x 3 at the time of the examination. The patient has apparent normal recent and remote memory, with an apparently normal attention span and concentration ability.   Speech is normal.  Cranial nerves: Extraocular movements are full.  Facial strength and sensation was normal.  No obvious hearing deficits are noted.  Motor:  Muscle bulk is normal.   Tone is normal. Strength is  5 / 5 in all 4 extremities.   Sensory: Sensory testing  is intact to soft touch and vibration sensation in all 4 extremities.  Coordination: Cerebellar testing reveals good finger-nose-finger.  Gait and station: Station is normal.  Gait and tandem gait are normal.  Romberg is negative.  Reflexes: Deep tendon reflexes are symmetric and normal bilaterally.       DIAGNOSTIC DATA (LABS, IMAGING, TESTING) - I reviewed patient records, labs, notes, testing and imaging myself where available.  Lab Results  Component Value Date   WBC 11.2 (H) 03/21/2019   HGB 15.8 03/21/2019   HCT 48.9 03/21/2019   MCV 88.1 03/21/2019   PLT 352 03/21/2019      Component Value Date/Time  NA 144 03/21/2019 0627   K 3.9 03/21/2019 0627   CL 109 03/21/2019 0627   CO2 23 03/21/2019 0627   GLUCOSE 103 (H) 03/21/2019 0627   BUN 5 (L) 03/21/2019 0627   CREATININE 0.67 03/21/2019 0627   CALCIUM 8.9 03/21/2019 0627   PROT 8.0 03/21/2019 0627   ALBUMIN 5.0 03/21/2019 0627   AST 25 03/21/2019 0627   ALT 23 03/21/2019 0627   ALKPHOS 27 (L) 03/21/2019 0627   BILITOT 0.5 03/21/2019 0627   GFRNONAA >60 03/21/2019 0627   GFRAA >60 03/21/2019 0627       ASSESSMENT AND PLAN  1. Chronic nonintractable headache, unspecified headache type   2. Neck pain   3. Migraine without aura and without status migrainosus, not intractable      1.   Since she is not getting benefit from topiramate, we will start an anti-CGRP injection.  I gave him samples for Ajovy.  If Ajovy is not preferred by his insurance we could also consider Emgality or Aimovig.   2.   Bernita Raisin for breakthrough migraines 3.   I filled out FMLA paperwork for him for intermittently up to 3 times a month rtc 3-4 months sooner if new or worsening migraines    Christian Miranda A. Epimenio Foot, MD, [facility] 08/03/2020, 5:33 PM Certified in Neurology, Clinical Neurophysiology, Sleep Medicine and Neuroimaging  [facility] [address], [address] [place], [place] [address] [phone]

## 2020-08-04 ENCOUNTER — Encounter: Payer: Self-pay | Admitting: Neurology

## 2020-08-08 NOTE — Telephone Encounter (Signed)
Called and spoke w/ pt. He believes he has insurance coverage. He is going to upload front/back of insurance cards to [employer] account. He will send mychart message once this is complete so we are aware it has been uploaded.

## 2020-08-08 NOTE — Telephone Encounter (Signed)
Called [facility] at [phone]. Spoke w/ Brett Canales. ID: [id number] BIN: [id number] [id number] Phone# [phone].  Tried initiating a new PA on CMM. IDH:[id number]. Eligibility could not be found still for pt.

## 2021-06-20 DIAGNOSIS — R000 Tachycardia, unspecified: Secondary | ICD-10-CM | POA: Diagnosis not present

## 2021-06-20 DIAGNOSIS — F411 Generalized anxiety disorder: Secondary | ICD-10-CM | POA: Diagnosis not present
